# Patient Record
Sex: Male | Born: 1990 | Race: Black or African American | Hispanic: No | Marital: Single | Smoking: Former smoker
Health system: Southern US, Community
[De-identification: ages and names within clinical notes are randomized; demographics above are authoritative.]

## PROBLEM LIST (undated history)

## (undated) HISTORY — PX: NASAL FRACTURE SURGERY: SHX718

## (undated) HISTORY — PX: SHOULDER SURGERY: SHX246

## (undated) HISTORY — PX: EYE SURGERY: SHX253

---
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
## 2015-10-22 ENCOUNTER — Encounter (HOSPITAL_BASED_OUTPATIENT_CLINIC_OR_DEPARTMENT_OTHER): Payer: Self-pay

## 2015-10-22 ENCOUNTER — Emergency Department (HOSPITAL_BASED_OUTPATIENT_CLINIC_OR_DEPARTMENT_OTHER)
Admission: EM | Admit: 2015-10-22 | Discharge: 2015-10-22 | Disposition: A | Discharge status: Home / Self Care | Payer: Medicaid Other | Attending: Emergency Medicine | Admitting: Emergency Medicine

## 2015-10-22 DIAGNOSIS — K029 Dental caries, unspecified: Secondary | ICD-10-CM | POA: Insufficient documentation

## 2015-10-22 DIAGNOSIS — F17200 Nicotine dependence, unspecified, uncomplicated: Secondary | ICD-10-CM | POA: Insufficient documentation

## 2015-10-22 DIAGNOSIS — K0889 Other specified disorders of teeth and supporting structures: Secondary | ICD-10-CM

## 2015-10-22 MED ORDER — HYDROCODONE-ACETAMINOPHEN 5-325 MG PO TABS: 2.0000 | ORAL_TABLET | ORAL | Status: DC | PRN

## 2015-10-22 MED ORDER — AMOXICILLIN 500 MG PO CAPS: 500.0000 mg | ORAL_CAPSULE | Freq: Three times a day (TID) | ORAL | Status: DC

## 2015-10-22 MED FILL — HYDROCODON-APAP 5-325: 5-325 | 2 days supply | Qty: 10 | Fill #0

## 2015-10-22 MED FILL — AMOXICILLIN 500 MG CAPSULE: 500 | 7 days supply | Qty: 21 | Fill #0

## 2015-10-22 NOTE — ED Notes (Signed)
bilat lower toothache-NAD-steady gait

## 2015-10-22 NOTE — ED Provider Notes (Signed)
CSN: [id number]     Arrival date & time 10/22/15  1251 History   First MD Initiated Contact with Patient 10/22/15 1332     Chief Complaint  Patient presents with  . Dental Pain     (Consider location/radiation/quality/duration/timing/severity/associated sxs/prior Treatment) Patient is a 25 y.o. male presenting with tooth pain. The history is provided by the patient. No language interpreter was used.  Dental Pain Location:  Lower Lower teeth location:  30/RL 1st molar, 19/LL 1st molar and 32/RL 3rd molar Quality:  Aching Severity:  Moderate Onset quality:  Sudden Timing:  Constant Progression:  Worsening Chronicity:  New Context: dental caries   Previous work-up:  Dental exam Relieved by:  Nothing Worsened by:  Nothing tried Associated symptoms: congestion   Associated symptoms: no fever   Risk factors: no diabetes     History reviewed. No pertinent past medical history. History reviewed. No pertinent past surgical history. No family history on file. Social History  Substance Use Topics  . Smoking status: Current Every Day Smoker  . Smokeless tobacco: None  . Alcohol Use: Yes     Comment: occ    Review of Systems  Constitutional: Negative for fever.  HENT: Positive for congestion.   All other systems reviewed and are negative.     Allergies  Review of patient's allergies indicates no known allergies.  Home Medications   Prior to Admission medications   Medication Sig Start Date End Date Taking? Authorizing Provider  amoxicillin (AMOXIL) 500 MG capsule Take 1 capsule (500 mg total) by mouth 3 (three) times daily. 10/22/15   Elson Areas, PA-C  HYDROcodone-acetaminophen (NORCO/VICODIN) 5-325 MG tablet Take 2 tablets by mouth every 4 (four) hours as needed. 10/22/15   Elson Areas, PA-C   BP 124/97 mmHg  Pulse 82  Temp(Src) 99 F (37.2 C) (Oral)  Resp 18  Ht  (1.88 m)  Wt 97.977 kg  BMI 27.72 kg/m2  SpO2 98% Physical Exam  Constitutional: He is  oriented to person, place, and time. He appears well-developed and well-nourished.  HENT:  Head: Normocephalic.  Broken decayed teeth.  Swelling left lower gum at 1st molar  Eyes: Conjunctivae and EOM are normal. Pupils are equal, round, and reactive to light.  Neck: Normal range of motion.  Pulmonary/Chest: Effort normal.  Abdominal: He exhibits no distension.  Musculoskeletal: Normal range of motion.  Neurological: He is alert and oriented to person, place, and time.  Psychiatric: He has a normal mood and affect.  Nursing note and vitals reviewed.   ED Course  Procedures (including critical care time) Labs Review Labs Reviewed - No data to display  Imaging Review No results found. I have personally reviewed and evaluated these images and lab results as part of my medical decision-making.   EKG Interpretation None      MDM   Final diagnoses:  Toothache   An After Visit Summary was printed and given to the patient. Meds ordered this encounter  Medications  . amoxicillin (AMOXIL) 500 MG capsule    Sig: Take 1 capsule (500 mg total) by mouth 3 (three) times daily.    Dispense:  21 capsule    Refill:  0    Order Specific Question:  Supervising Provider    Answer:  MILLER, BRIAN [[id number]]  . HYDROcodone-acetaminophen (NORCO/VICODIN) 5-325 MG tablet    Sig: Take 2 tablets by mouth every 4 (four) hours as needed.    Dispense:  10 tablet    Refill:  0    Order Specific Question:  Supervising Provider    Answer:  Eber HongMILLER, BRIAN [[id number]]   No outpatient prescriptions have been marked as taking for the 10/22/15 encounter [facility](Hospital Encounter).    Lonia SkinnerLeslie K ProtivinSofia, PA-C 10/22/15 1404  Marily MemosJason Mesner, MD 10/22/15 [phone]

## 2015-10-22 NOTE — Discharge Instructions (Signed)

## 2017-01-04 ENCOUNTER — Emergency Department (HOSPITAL_BASED_OUTPATIENT_CLINIC_OR_DEPARTMENT_OTHER)
Admission: EM | Admit: 2017-01-04 | Discharge: 2017-01-04 | Disposition: A | Discharge status: Home / Self Care | Payer: Self-pay | Attending: Emergency Medicine | Admitting: Emergency Medicine

## 2017-01-04 ENCOUNTER — Encounter (HOSPITAL_BASED_OUTPATIENT_CLINIC_OR_DEPARTMENT_OTHER): Payer: Self-pay | Admitting: *Deleted

## 2017-01-04 ENCOUNTER — Emergency Department (HOSPITAL_BASED_OUTPATIENT_CLINIC_OR_DEPARTMENT_OTHER): Payer: Self-pay

## 2017-01-04 DIAGNOSIS — F17200 Nicotine dependence, unspecified, uncomplicated: Secondary | ICD-10-CM | POA: Insufficient documentation

## 2017-01-04 DIAGNOSIS — R059 Cough, unspecified: Secondary | ICD-10-CM

## 2017-01-04 DIAGNOSIS — R05 Cough: Secondary | ICD-10-CM | POA: Insufficient documentation

## 2017-01-04 NOTE — ED Triage Notes (Signed)
Pt reports seeing dark red blood when he clears his throat in the morning, and at times during the day when he coughs.

## 2017-01-04 NOTE — Discharge Instructions (Signed)
Continue drinking fluids at home to remain hydrated. I also recommend using a humidifier in your room at night prevent drying out of your nose/sinuses.  Please follow up with a primary care provider from the Resource Guide provided below in 1 week as needed. Please return to the Emergency Department if symptoms worsen or new onset of fever, chest pain, shortness of breath, coughing up bright red blood, vomiting blood, abdominal pain.

## 2017-01-04 NOTE — ED Provider Notes (Signed)
[facility] Provider Note   CSN: [id number] Arrival date & time: 01/04/17  1002     History   Chief Complaint Chief Complaint  Patient presents with  . Cough    HPI Diamantina MonksBraxton D Haddix is a 26 y.o. male.  HPI   Patient is a 26 year old male with no pertinent past medical history presents the ED with complaint of cough. Patient states over the past 3-4 days he has been coughing up a small amount of dark red mucous/sputum in the mornings. He notes having a mild productive cough throughout the day which improves after clearing his throat. Denies fever, chills, lightheadedness, headache, nasal congestion, rhinorrhea, sore throat, chest pain, shortness of breath, wheezing, palpitations, abdominal pain, nausea, vomiting, diarrhea, blood in urine or stool, numbness, tingling, weakness. Patient denies taking any medications at home for his symptoms. Denies smoking.  History reviewed. No pertinent past medical history.  There are no active problems to display for this patient.   History reviewed. No pertinent surgical history.     Home Medications    Prior to Admission medications   Not on File    Family History History reviewed. No pertinent family history.  Social History Social History  Substance Use Topics  . Smoking status: Current Every Day Smoker  . Smokeless tobacco: Never Used  . Alcohol use Yes     Comment: occ     Allergies   Patient has no known allergies.   Review of Systems Review of Systems  Respiratory: Positive for cough.   All other systems reviewed and are negative.    Physical Exam Updated Vital Signs BP 132/86 (BP Location: Right Arm)   Pulse 62   Temp 97.6 F (36.4 C) (Oral)   Resp 18   Ht 6\' 1"  (1.854 m)   Wt 95.3 kg (210 lb)   SpO2 98%   BMI 27.71 kg/m   Physical Exam  Constitutional: He is oriented to person, place, and time. He appears well-developed and well-nourished. No distress.  HENT:  Head: Normocephalic and  atraumatic.  Mouth/Throat: Uvula is midline, oropharynx is clear and moist and mucous membranes are normal. No oropharyngeal exudate, posterior oropharyngeal edema, posterior oropharyngeal erythema or tonsillar abscesses. No tonsillar exudate.  Eyes: Conjunctivae and EOM are normal. Right eye exhibits no discharge. Left eye exhibits no discharge. No scleral icterus.  Neck: Normal range of motion. Neck supple.  Cardiovascular: Normal rate, regular rhythm, normal heart sounds and intact distal pulses.   Pulmonary/Chest: Effort normal and breath sounds normal. No respiratory distress. He has no wheezes. He has no rales. He exhibits no tenderness.  Abdominal: Soft. Bowel sounds are normal. He exhibits no distension and no mass. There is no tenderness. There is no rebound and no guarding. No hernia.  Musculoskeletal: Normal range of motion. He exhibits no edema.  Neurological: He is alert and oriented to person, place, and time.  Skin: Skin is warm and dry. He is not diaphoretic.  Nursing note and vitals reviewed.    ED Treatments / Results  Labs (all labs ordered are listed, but only abnormal results are displayed) Labs Reviewed - No data to display  EKG  EKG Interpretation None       Radiology Dg Chest 2 View  Result Date: 01/04/2017 CLINICAL DATA:  Hemoptysis. EXAM: CHEST  2 VIEW COMPARISON:  None. FINDINGS: The heart size and mediastinal contours are within normal limits. Both lungs are clear. The visualized skeletal structures are unremarkable. IMPRESSION: Negative two view  chest x-ray Electronically Signed   By: Marin Robertshristopher  Mattern M.D.   On: 01/04/2017 10:58    Procedures Procedures (including critical care time)  Medications Ordered in ED Medications - No data to display   Initial Impression / Assessment and Plan / ED Course  I have reviewed the triage vital signs and the nursing notes.  Pertinent labs & imaging results that were available during my care of the patient  were reviewed by me and considered in my medical decision making (see chart for details).     Patient presents with mild productive cough with small amount of dark red sputum that typically occurs in the morning. Denies fever, chest pain, shortness of breath, nasal congestion, nose bleeds, rhinorrhea, abdominal pain, vomiting, blood in urine or stool. VSS. Exam unremarkable. Chest x-ray negative.  Suspect pt's sxs may be due to drying of nasal mucosa/sinuses at night. I do not suspect cancer, pneumonia, sinus infection, TB, PE, or CHF and do not feel that further workup or imaging is warranted at this time.  Discussed results and plan for discharge with patient. Discussed symptomatically treatment. Advised follow up with PCP as needed. Discussed return precautions.  Final Clinical Impressions(s) / ED Diagnoses   Final diagnoses:  Cough    New Prescriptions New Prescriptions   No medications on file     Barrett Henleadeau, Shaleigh Laubscher Elizabeth, Cordelia Poche-C 01/04/17 1130    Geoffery Lyonselo, Douglas, MD 01/04/17 1153

## 2018-10-14 ENCOUNTER — Encounter (HOSPITAL_BASED_OUTPATIENT_CLINIC_OR_DEPARTMENT_OTHER): Payer: Self-pay | Admitting: Emergency Medicine

## 2018-10-14 ENCOUNTER — Emergency Department (HOSPITAL_BASED_OUTPATIENT_CLINIC_OR_DEPARTMENT_OTHER)
Admission: EM | Admit: 2018-10-14 | Discharge: 2018-10-14 | Disposition: A | Discharge status: Home / Self Care | Payer: No Typology Code available for payment source | Attending: Emergency Medicine | Admitting: Emergency Medicine

## 2018-10-14 ENCOUNTER — Other Ambulatory Visit: Payer: Self-pay

## 2018-10-14 ENCOUNTER — Emergency Department (HOSPITAL_BASED_OUTPATIENT_CLINIC_OR_DEPARTMENT_OTHER): Payer: No Typology Code available for payment source

## 2018-10-14 DIAGNOSIS — S80812A Abrasion, left lower leg, initial encounter: Secondary | ICD-10-CM | POA: Insufficient documentation

## 2018-10-14 DIAGNOSIS — Y9389 Activity, other specified: Secondary | ICD-10-CM | POA: Diagnosis not present

## 2018-10-14 DIAGNOSIS — F17200 Nicotine dependence, unspecified, uncomplicated: Secondary | ICD-10-CM | POA: Diagnosis not present

## 2018-10-14 DIAGNOSIS — Y92410 Unspecified street and highway as the place of occurrence of the external cause: Secondary | ICD-10-CM | POA: Diagnosis not present

## 2018-10-14 DIAGNOSIS — S39012A Strain of muscle, fascia and tendon of lower back, initial encounter: Secondary | ICD-10-CM | POA: Diagnosis not present

## 2018-10-14 DIAGNOSIS — Y999 Unspecified external cause status: Secondary | ICD-10-CM | POA: Insufficient documentation

## 2018-10-14 DIAGNOSIS — M25571 Pain in right ankle and joints of right foot: Secondary | ICD-10-CM | POA: Diagnosis not present

## 2018-10-14 DIAGNOSIS — S3992XA Unspecified injury of lower back, initial encounter: Secondary | ICD-10-CM | POA: Diagnosis present

## 2018-10-14 DIAGNOSIS — M79641 Pain in right hand: Secondary | ICD-10-CM | POA: Insufficient documentation

## 2018-10-14 DIAGNOSIS — S161XXA Strain of muscle, fascia and tendon at neck level, initial encounter: Secondary | ICD-10-CM | POA: Diagnosis not present

## 2018-10-14 DIAGNOSIS — M25512 Pain in left shoulder: Secondary | ICD-10-CM | POA: Insufficient documentation

## 2018-10-14 DIAGNOSIS — R51 Headache: Secondary | ICD-10-CM | POA: Insufficient documentation

## 2018-10-14 MED ORDER — METHOCARBAMOL 750 MG PO TABS: 750.0000 mg | ORAL_TABLET | Freq: Two times a day (BID) | ORAL | 0 refills | Status: DC

## 2018-10-14 MED ORDER — IBUPROFEN 400 MG PO TABS
600.0000 mg | ORAL_TABLET | Freq: Once | ORAL | Status: AC
  Administered 2018-10-14: 600 mg via ORAL
  Filled 2018-10-14: qty 1

## 2018-10-14 MED ORDER — ACETAMINOPHEN 500 MG PO TABS: 500.0000 mg | ORAL_TABLET | Freq: Four times a day (QID) | ORAL | 0 refills | Status: DC | PRN

## 2018-10-14 MED ORDER — IBUPROFEN 400 MG PO TABS
ORAL_TABLET | ORAL | Status: AC
  Filled 2018-10-14: qty 1

## 2018-10-14 MED ORDER — IBUPROFEN 600 MG PO TABS: 600.0000 mg | ORAL_TABLET | Freq: Four times a day (QID) | ORAL | 0 refills | Status: DC | PRN

## 2018-10-14 NOTE — Discharge Instructions (Signed)
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
## 2018-10-14 NOTE — ED Notes (Signed)
Pt verbalized understanding of dc instructions.

## 2018-11-02 ENCOUNTER — Emergency Department (HOSPITAL_BASED_OUTPATIENT_CLINIC_OR_DEPARTMENT_OTHER): Payer: Self-pay

## 2018-11-02 ENCOUNTER — Other Ambulatory Visit: Payer: Self-pay

## 2018-11-02 ENCOUNTER — Emergency Department (HOSPITAL_BASED_OUTPATIENT_CLINIC_OR_DEPARTMENT_OTHER)
Admission: EM | Admit: 2018-11-02 | Discharge: 2018-11-02 | Disposition: A | Discharge status: Home / Self Care | Payer: Self-pay | Attending: Emergency Medicine | Admitting: Emergency Medicine

## 2018-11-02 ENCOUNTER — Encounter (HOSPITAL_BASED_OUTPATIENT_CLINIC_OR_DEPARTMENT_OTHER): Payer: Self-pay | Admitting: *Deleted

## 2018-11-02 DIAGNOSIS — S61422A Laceration with foreign body of left hand, initial encounter: Secondary | ICD-10-CM

## 2018-11-02 DIAGNOSIS — Y999 Unspecified external cause status: Secondary | ICD-10-CM | POA: Insufficient documentation

## 2018-11-02 DIAGNOSIS — Y9389 Activity, other specified: Secondary | ICD-10-CM | POA: Insufficient documentation

## 2018-11-02 DIAGNOSIS — S91311A Laceration without foreign body, right foot, initial encounter: Secondary | ICD-10-CM

## 2018-11-02 DIAGNOSIS — W25XXXA Contact with sharp glass, initial encounter: Secondary | ICD-10-CM | POA: Insufficient documentation

## 2018-11-02 DIAGNOSIS — M25511 Pain in right shoulder: Secondary | ICD-10-CM

## 2018-11-02 DIAGNOSIS — Y92018 Other place in single-family (private) house as the place of occurrence of the external cause: Secondary | ICD-10-CM | POA: Insufficient documentation

## 2018-11-02 DIAGNOSIS — M79641 Pain in right hand: Secondary | ICD-10-CM

## 2018-11-02 DIAGNOSIS — Z79899 Other long term (current) drug therapy: Secondary | ICD-10-CM | POA: Insufficient documentation

## 2018-11-02 DIAGNOSIS — T148XXA Other injury of unspecified body region, initial encounter: Secondary | ICD-10-CM

## 2018-11-02 MED ORDER — BACITRACIN ZINC 500 UNIT/GM EX OINT
TOPICAL_OINTMENT | Freq: Once | CUTANEOUS | Status: AC
  Administered 2018-11-02: 1 via TOPICAL
  Filled 2018-11-02: qty 28.35

## 2018-11-02 MED ORDER — MUPIROCIN 2 % EX OINT: TOPICAL_OINTMENT | CUTANEOUS | 0 refills | Status: DC

## 2018-11-02 MED ORDER — TETANUS-DIPHTH-ACELL PERTUSSIS 5-2.5-18.5 LF-MCG/0.5 IM SUSP
0.5000 mL | Freq: Once | INTRAMUSCULAR | Status: AC
  Administered 2018-11-02: 14:00:00 0.5 mL via INTRAMUSCULAR
  Filled 2018-11-02: qty 0.5

## 2018-11-02 MED ORDER — NAPROXEN 500 MG PO TABS: 500.0000 mg | ORAL_TABLET | Freq: Two times a day (BID) | ORAL | 0 refills | Status: DC | PRN

## 2018-11-02 MED ORDER — LIDOCAINE HCL (PF) 1 % IJ SOLN
INTRAMUSCULAR | Status: AC
  Administered 2018-11-02: 5 mL
  Filled 2018-11-02: qty 5

## 2018-11-02 MED ORDER — ACETAMINOPHEN 500 MG PO TABS
1000.0000 mg | ORAL_TABLET | Freq: Once | ORAL | Status: AC
  Administered 2018-11-02: 1000 mg via ORAL
  Filled 2018-11-02: qty 2

## 2018-11-02 MED ORDER — CEPHALEXIN 500 MG PO CAPS: 500.0000 mg | ORAL_CAPSULE | Freq: Two times a day (BID) | ORAL | 0 refills | Status: AC

## 2018-11-02 MED ORDER — NAPROXEN 250 MG PO TABS
500.0000 mg | ORAL_TABLET | Freq: Once | ORAL | Status: AC
  Administered 2018-11-02: 500 mg via ORAL
  Filled 2018-11-02: qty 2

## 2018-11-02 NOTE — Discharge Instructions (Addendum)
It was my pleasure taking care of you today!   It is VERY important that you keep all your wounds clean and dry. Soak left foot and right hand in 1/2 water, 1/2 peroxide like we did today twice daily.   Ibuprofen as needed for pain.  You can also take Tylenol over-the-counter as needed for additional pain relief.  Please take all of your antibiotics until finished!  This is to help prevent infections of your wounds.   Please keep a close eye on them, should any start become red or swollen, you should seek care, either at an urgent care, your primary doctor or return to the emergency department.  You can return to ER for new symptoms or any additional concerns.  I highly encourage you to follow-up with a primary care doctor.  I have also included the orthopedist doctor's information for you to call to schedule follow-up.

## 2018-11-02 NOTE — ED Notes (Signed)
Both EDPs at bedside assessing Pt. At present time.  Pt. Very slow to respond to questions.  Pt. GCS is 15

## 2018-11-02 NOTE — ED Notes (Signed)
ED Provider at bedside. 

## 2018-11-02 NOTE — ED Notes (Signed)
EMT placing dressings on Pt. Wounds that have been cleaned.

## 2018-11-02 NOTE — ED Notes (Signed)
Pt. Reports the R side of his head hurting.  Pt. Has noted dried blood in the L ear.  After the L ear cleaned still has some blood with wax in the ear canal.  Reported to EDP by RN
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
## 2018-11-02 NOTE — ED Notes (Signed)
Pt. Was given drink and snack at time of discharge due to being hungry and thirsty.  Pt. Was taken out in W/C

## 2018-11-02 NOTE — ED Notes (Signed)
Pt. Also has noted abrasions to the his R side back.
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
## 2018-11-02 NOTE — ED Provider Notes (Signed)
[facility] EMERGENCY DEPARTMENT Provider Note   CSN: [id number] Arrival date & time: 11/02/18  1226    History   Chief Complaint Chief Complaint  Patient presents with   Laceration   Hand Injury   Shoulder Injury    HPI Wyatt Rivera is a 28 y.o. male.     The history is provided by the patient and medical records. No language interpreter was used.  Laceration  Hand Injury  Shoulder Injury  Associated symptoms include headaches.   Wyatt Rivera is a 28 y.o. male who presents to the Emergency Department for evaluation after obtaining multiple skin wounds and injuries about 9 PM last night.  Patient states that he was playing around at home when he tried to run through a glass patio door.  He sustained multiple superficial abrasions to trunk, right thigh other scattered areas to the front of his body.  He has much larger lacerations to the left foot as well as the right hand.  He believes that he hit his head.  He is unsure if he passed out, but thinks that he might have.  He said that a significant other of his saw this happen, but is unsure if he passed out or not either.  After this happened, he became very angry and punched a cement wall with his right hand.  Now endorses pain to the right hand as well as swelling.  Also has pain and swelling to the left hand, but does not remember hitting anything or reason for this.  Has pain to his right shoulder as well.  No medications taken prior to arrival for symptoms.  Unsure if his tetanus is up-to-date or not.   History reviewed. No pertinent past medical history.  There are no active problems to display for this patient.   Past Surgical History:  Procedure Laterality Date   NASAL FRACTURE SURGERY     SHOULDER SURGERY          Home Medications    Prior to Admission medications   Medication Sig Start Date End Date Taking? Authorizing Provider  acetaminophen (TYLENOL) 500 MG tablet Take 1 tablet (500 mg  total) by mouth every 6 (six) hours as needed. 10/14/18  Yes Law, Waylan Boga, PA-C  ibuprofen (ADVIL) 600 MG tablet Take 1 tablet (600 mg total) by mouth every 6 (six) hours as needed. 10/14/18  Yes Law, Waylan Boga, PA-C  cephALEXin (KEFLEX) 500 MG capsule Take 1 capsule (500 mg total) by mouth 2 (two) times daily for 5 days. 11/02/18 11/07/18  Shekina Cordell, Chase Picket, PA-C  methocarbamol (ROBAXIN) 750 MG tablet Take 1 tablet (750 mg total) by mouth 2 (two) times daily. 10/14/18   Law, Waylan Boga, PA-C  mupirocin ointment (BACTROBAN) 2 % Apply to wounds twice daily. 11/02/18   Belynda Pagaduan, Chase Picket, PA-C  naproxen (NAPROSYN) 500 MG tablet Take 1 tablet (500 mg total) by mouth 2 (two) times daily as needed for mild pain or moderate pain. 11/02/18   Miosha Behe, Chase Picket, PA-C    Family History No family history on file.  Social History Social History   Tobacco Use   Smoking status: Current Every Day Smoker   Smokeless tobacco: Never Used  Substance Use Topics   Alcohol use: Yes    Comment: occ   Drug use: No     Allergies   Patient has no known allergies.   Review of Systems Review of Systems  Musculoskeletal: Positive for arthralgias and myalgias.  Skin: Positive for wound.  Neurological: Positive for headaches.  All other systems reviewed and are negative.    Physical Exam Updated Vital Signs BP 125/74 (BP Location: Right Arm)    Pulse 67    Temp 98.6 F (37 C) (Oral)    Resp 18    Ht 6\' 2"  (1.88 m)    Wt 47.3 kg    SpO2 96%    BMI 13.38 kg/m   Physical Exam Vitals signs and nursing note reviewed.  Constitutional:      General: He is not in acute distress.    Appearance: He is well-developed.  HENT:     Head: Normocephalic and atraumatic.  Neck:     Musculoskeletal: Neck supple.  Cardiovascular:     Rate and Rhythm: Normal rate and regular rhythm.     Heart sounds: Normal heart sounds. No murmur.  Pulmonary:     Effort: Pulmonary effort is normal. No respiratory distress.      Breath sounds: Normal breath sounds.  Abdominal:     General: There is no distension.     Palpations: Abdomen is soft.     Tenderness: There is no abdominal tenderness.  Musculoskeletal:     Comments: No C/T/L spine tenderness. All four extremities with full ROM and 5/5 muscle strength including strong grip strength and pincer grasp. Moves toes without a problem. Sensation equal and intact in all 4 extremities as well as intact distal pulses x4.  He does have diffuse tenderness to the anterior shoulder.  Negative Neer's.  Full range of motion of the shoulder joint.  No step-off or deformity.  No crepitus.  His right hand has tenderness to the fourth and fifth metacarpals with associated swelling.  Left hand has no bony tenderness, but does also have swelling to this fourth and fifth metacarpal region.   Skin:    General: Skin is warm and dry.     Comments: Multiple superficial scattered abrasions to trunk, right upper thigh and bottom of his feet.  Left palm has several lacerations, but mostly just macerated skin to this area with nothing to repair.  Two approximately 2 cm skin tears to the left shin. 4 cm laceration to the arch of the right foot.  4 cm laceration to the base of the great toe.  Approximately 6 cm "c" shaped laceration to the dorsum of the right foot.  Neurological:     Mental Status: He is alert and oriented to person, place, and time.     Comments: Speech goal oriented. CN 2-12 grossly intact. Strength and sensation intact. Steady gait.       ED Treatments / Results  Labs (all labs ordered are listed, but only abnormal results are displayed) Labs Reviewed - No data to display  EKG None  Radiology Dg Shoulder Right  Result Date: 11/02/2018 CLINICAL DATA:  Pt went through a glass door,right shoulder pain,left wrist cut, right knuckle pain,right foot with many lacerations EXAM: RIGHT SHOULDER - 2+ VIEW COMPARISON:  None. FINDINGS: There is no evidence of fracture or  dislocation. There is no evidence of arthropathy or other focal bone abnormality. Soft tissues are unremarkable. IMPRESSION: Negative. Electronically Signed   By: Elige KoHetal  Patel   On: 11/02/2018 14:35   Ct Head Wo Contrast  Result Date: 11/02/2018 CLINICAL DATA:  Posttraumatic headache after injury last night. Probable loss of consciousness. EXAM: CT HEAD WITHOUT CONTRAST TECHNIQUE: Contiguous axial images were obtained from the base of the skull through the vertex  without intravenous contrast. COMPARISON:  CT scan of June 13, 2008. FINDINGS: Brain: No evidence of acute infarction, hemorrhage, hydrocephalus, extra-axial collection or mass lesion/mass effect. Vascular: No hyperdense vessel or unexpected calcification. Skull: Normal. Negative for fracture or focal lesion. Sinuses/Orbits: No acute finding. Other: None. IMPRESSION: Normal head CT. Electronically Signed   By: Lupita Raider M.D.   On: 11/02/2018 14:44   Dg Hand 2 View Left  Result Date: 11/02/2018 CLINICAL DATA:  Laceration, fell through glass door. EXAM: LEFT HAND - 2 VIEW COMPARISON:  Images earlier today FINDINGS: Previously seen 8 mm radiopaque density anterior to the left wrist within the volar soft tissues no longer visualized. There is a radiopaque density projecting between the 4th and 5th metacarpals which was present on prior study and may be old, as this is well away from the wrist laceration. Recommend clinical correlation to exclude 2nd laceration in this area. No fracture, subluxation or dislocation. IMPRESSION: Previously seen radiopaque density anterior to the left wrist is no longer visualized. Smaller density between the 4th and 5th metacarpals remains present, age indeterminate. Electronically Signed   By: Charlett Nose M.D.   On: 11/02/2018 16:52   Dg Hand Complete Left  Result Date: 11/02/2018 CLINICAL DATA:  Patient went through glass door. EXAM: RIGHT HAND - COMPLETE 3+ VIEW; LEFT HAND - COMPLETE 3+ VIEW COMPARISON:   Right hand x-rays dated Oct 14, 2018. FINDINGS: Left hand: 8 mm radiopaque foreign body over the volar to the hook of the hamate with overlying soft tissue irregularity. 4 mm rectangular radiopaque foreign body along the volar aspect of the fourth metacarpal neck. No acute fracture or dislocation. Joint spaces are preserved. Bone mineralization is normal. Right hand: No acute fracture or dislocation. Joint spaces are preserved. Bone mineralization is normal. Dorsal soft tissue swelling. No radiopaque foreign body identified. IMPRESSION: 1. Volar soft tissue injury of the left wrist overlying the hook of the hamate with 8 mm radiopaque foreign body. 2. Possible additional 4 mm radiopaque foreign body along the volar aspect of the left fourth metacarpal neck. Correlate for soft tissue injury in this area. 3. Right hand dorsal soft tissue swelling. No radiopaque foreign body. 4.  No acute osseous abnormality. Electronically Signed   By: Obie Dredge M.D.   On: 11/02/2018 14:44   Dg Hand Complete Right  Result Date: 11/02/2018 CLINICAL DATA:  Patient went through glass door. EXAM: RIGHT HAND - COMPLETE 3+ VIEW; LEFT HAND - COMPLETE 3+ VIEW COMPARISON:  Right hand x-rays dated Oct 14, 2018. FINDINGS: Left hand: 8 mm radiopaque foreign body over the volar to the hook of the hamate with overlying soft tissue irregularity. 4 mm rectangular radiopaque foreign body along the volar aspect of the fourth metacarpal neck. No acute fracture or dislocation. Joint spaces are preserved. Bone mineralization is normal. Right hand: No acute fracture or dislocation. Joint spaces are preserved. Bone mineralization is normal. Dorsal soft tissue swelling. No radiopaque foreign body identified. IMPRESSION: 1. Volar soft tissue injury of the left wrist overlying the hook of the hamate with 8 mm radiopaque foreign body. 2. Possible additional 4 mm radiopaque foreign body along the volar aspect of the left fourth metacarpal neck. Correlate  for soft tissue injury in this area. 3. Right hand dorsal soft tissue swelling. No radiopaque foreign body. 4.  No acute osseous abnormality. Electronically Signed   By: Obie Dredge M.D.   On: 11/02/2018 14:44   Dg Foot Complete Right  Result Date: 11/02/2018  CLINICAL DATA:  Patient fell through a glass door. Right foot pain and lacerations. EXAM: RIGHT FOOT COMPLETE - 3+ VIEW COMPARISON:  Ankle radiographs 10/14/2010 FINDINGS: Multiple lacerations are noted. The joint spaces are maintained.  No acute bony findings. Small radiopaque foreign body noted in the superficial plantar soft tissues of the great toe. IMPRESSION: No acute bony findings. Small radiopaque radiopaque foreign body the superficial soft tissues along the plantar aspect of the great toe Electronically Signed   By: Rudie Meyer M.D.   On: 11/02/2018 14:42    Procedures .Marland KitchenLaceration Repair Date/Time: 11/02/2018 5:10 PM Performed by: Avital Dancy, Chase Picket, PA-C Authorized by: Arvil Utz, Chase Picket, PA-C   Consent:    Consent obtained:  Verbal   Consent given by:  Patient   Risks discussed:  Pain, infection, poor cosmetic result and poor wound healing Anesthesia (see MAR for exact dosages):    Anesthesia method:  Local infiltration   Local anesthetic:  Lidocaine 2% w/o epi Laceration details:    Location:  Foot   Foot location:  Top of R foot   Length (cm):  6 Repair type:    Repair type:  Simple Pre-procedure details:    Preparation:  Patient was prepped and draped in usual sterile fashion and imaging obtained to evaluate for foreign bodies Exploration:    Hemostasis achieved with:  Direct pressure   Wound exploration: wound explored through full range of motion and entire depth of wound probed and visualized     Wound extent: no foreign bodies/material noted, no muscle damage noted, no tendon damage noted and no underlying fracture noted   Treatment:    Area cleansed with:  Saline   Amount of cleaning:  Standard    Irrigation solution:  Sterile saline Skin repair:    Repair method:  Sutures   Suture size:  4-0   Wound skin closure material used: Vicryl rapide.   Suture technique:  Simple interrupted   Number of sutures:  2 Approximation:    Approximation:  Loose Post-procedure details:    Dressing:  Antibiotic ointment   Patient tolerance of procedure:  Tolerated well, no immediate complications   .Foreign Body Removal Date/Time: 11/02/2018 5:44 PM Performed by: Jameela Michna, Chase Picket, PA-C Authorized by: Keyerra Lamere, Chase Picket, PA-C  Consent: Verbal consent obtained. Patient understanding: patient states understanding of the procedure being performed Imaging studies: imaging studies available Patient identity confirmed: verbally with patient and arm band Body area: skin General location: upper extremity Location details: right hand 1 objects recovered. Objects recovered: glass Post-procedure assessment: foreign body removed Patient tolerance: Patient tolerated the procedure well with no immediate complications .Foreign Body Removal Date/Time: 11/02/2018 5:45 PM Performed by: Landry Lookingbill, Chase Picket, PA-C Authorized by: Darcia Lampi, Chase Picket, PA-C  Consent: Verbal consent obtained. Consent given by: patient Patient understanding: patient states understanding of the procedure being performed Imaging studies: imaging studies available Patient identity confirmed: verbally with patient and arm band Body area: skin General location: lower extremity Location details: right foot 1 objects recovered. Objects recovered: glass Post-procedure assessment: foreign body removed Patient tolerance: Patient tolerated the procedure well with no immediate complications   (including critical care time)  Medications Ordered in ED Medications  Tdap (BOOSTRIX) injection 0.5 mL (0.5 mLs Intramuscular Given 11/02/18 1344)  acetaminophen (TYLENOL) tablet 1,000 mg (1,000 mg Oral Given 11/02/18 1343)  naproxen  (NAPROSYN) tablet 500 mg (500 mg Oral Given 11/02/18 1343)  lidocaine (PF) (XYLOCAINE) 1 % injection (5 mLs  Given 11/02/18 1728)  bacitracin  ointment (1 application Topical Given 11/02/18 1727)     Initial Impression / Assessment and Plan / ED Course  I have reviewed the triage vital signs and the nursing notes.  Pertinent labs & imaging results that were available during my care of the patient were reviewed by me and considered in my medical decision making (see chart for details).       LAGREGORY HORI is a 28 y.o. male who presents to ED for evaluation after trying to run through glass patio door last night around 9 PM which subsequently shattered and caused him to sustain multiple lacerations and abrasions to multiple areas of his body.  He then states he got frustrated and punched a cement wall with his right hand.  He does have tenderness and swelling to the right hand concerning for possible boxer's fracture.  I did obtain an x-ray of this area which was normal without any acute abnormalities.  Placed in Ace wrap.  He had tenderness of the right shoulder as well with negative imaging.  Discussed rice and nsaid use for orthopedic injuries and will have him follow-up with PCP or ortho if symptoms do not improve.  In regards to his multiple skin wounds, all of which were thoroughly cleaned and irrigated by nursing staff and myself.  His tetanus was updated.  Wounds were evaluated by my attending, Dr. Donnald Garre.  At her recommendation,  I placed 2 loose sutures to tack down one laceration down as it was quite open. Given the risk of infection of these wounds after such a prolonged period of time since injury occurrence, decision was made to let the remainder of his wounds heal without repair.  I discussed home wound care instructions with him including half water half hydrogen peroxide soaks, topical antibiotic ointment, monitoring for infection.  Left hand x-ray did show 8 mm radiopaque foreign body as  well as possible 4 mm foreign body along the volar aspect of the left metacarpal neck.  There is no open skin wound to this metacarpal neck area -possible this FB is old.  After thorough irrigation, cleansing him probing, likely piece of glass seen on imaging was removed. Repeat imaging without signs of this FB.  There was also a small radiopaque foreign body in the superficial soft tissue along the plantar surface of the great toe which I was able to remove without any difficulty.   Evaluation does not show pathology that would require ongoing emergent intervention or inpatient treatment. Symptomatic home care instructions and wound care discussed. Ortho and/or PCP follow up encouraged. Reasons to return to ER were discussed and all questions answered.   Patient seen by and discussed with Dr. Donnald Garre who agrees with treatment plan.    Final Clinical Impressions(s) / ED Diagnoses   Final diagnoses:  Acute pain of right shoulder  Right hand pain  Superficial abrasion  Laceration of right foot, initial encounter  Laceration of left hand with foreign body, initial encounter    ED Discharge Orders         Ordered    naproxen (NAPROSYN) 500 MG tablet  2 times daily PRN     11/02/18 1721    mupirocin ointment (BACTROBAN) 2 %     11/02/18 1721    cephALEXin (KEFLEX) 500 MG capsule  2 times daily     11/02/18 1721           Pacer Dorn, Chase Picket, PA-C 11/02/18 Donavan Burnet, MD 11/17/18 1431

## 2020-02-24 ENCOUNTER — Emergency Department (HOSPITAL_BASED_OUTPATIENT_CLINIC_OR_DEPARTMENT_OTHER)
Admission: EM | Admit: 2020-02-24 | Discharge: 2020-02-24 | Disposition: A | Discharge status: Home / Self Care | Payer: 59 | Attending: Emergency Medicine | Admitting: Emergency Medicine

## 2020-02-24 ENCOUNTER — Other Ambulatory Visit: Payer: Self-pay

## 2020-02-24 ENCOUNTER — Emergency Department (HOSPITAL_BASED_OUTPATIENT_CLINIC_OR_DEPARTMENT_OTHER): Payer: 59

## 2020-02-24 ENCOUNTER — Encounter (HOSPITAL_BASED_OUTPATIENT_CLINIC_OR_DEPARTMENT_OTHER): Payer: Self-pay

## 2020-02-24 DIAGNOSIS — S0993XA Unspecified injury of face, initial encounter: Secondary | ICD-10-CM | POA: Diagnosis present

## 2020-02-24 DIAGNOSIS — S02642A Fracture of ramus of left mandible, initial encounter for closed fracture: Secondary | ICD-10-CM | POA: Insufficient documentation

## 2020-02-24 DIAGNOSIS — Z87891 Personal history of nicotine dependence: Secondary | ICD-10-CM | POA: Diagnosis not present

## 2020-02-24 DIAGNOSIS — Y92838 Other recreation area as the place of occurrence of the external cause: Secondary | ICD-10-CM | POA: Insufficient documentation

## 2020-02-24 MED ORDER — SENNOSIDES-DOCUSATE SODIUM 8.6-50 MG PO TABS: 1.0000 | ORAL_TABLET | Freq: Every day | ORAL | 0 refills | Status: AC

## 2020-02-24 MED ORDER — IBUPROFEN 800 MG PO TABS: 800.0000 mg | ORAL_TABLET | Freq: Three times a day (TID) | ORAL | 0 refills | Status: DC | PRN

## 2020-02-24 MED ORDER — OXYCODONE-ACETAMINOPHEN 5-325 MG PO TABS: 1.0000 | ORAL_TABLET | Freq: Four times a day (QID) | ORAL | 0 refills | Status: DC | PRN
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
## 2020-02-24 NOTE — Discharge Instructions (Signed)
You were seen in the emergency department today with fracture of your jaw.  You need to follow-up with an ENT doctor in the next week.  I have listed the name of the doctor on-call, Dr. Ross Marcus.  His office is in [place].  I have also listed the name of our local ENT office.  Please call tomorrow morning to schedule a follow-up this week.   You need to stick with a completely liquid diet until cleared to move forward with a regular diet by ENT.  I have called in some pain medications to your pharmacy.  Please take them only as directed and do not take them with alcohol or other pain medicines.  They can cause some constipation so called in constipation medicine for you as well.
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
## 2020-03-10 ENCOUNTER — Encounter (HOSPITAL_COMMUNITY): Payer: Self-pay | Admitting: Oral Surgery

## 2020-03-10 NOTE — Progress Notes (Signed)
Patient denies shortness of breath, fever, cough or chest pain.  PCP - None Cardiologist - n/a  Chest x-ray - n/a EKG - n/a Stress Test - n/a ECHO - n/a Cardiac Cath - n/a  STOP now taking any Aspirin (unless otherwise instructed by your surgeon), Aleve, Naproxen, Ibuprofen, Motrin, Advil, Goody's, BC's, all herbal medications, fish oil, and all vitamins.   Coronavirus Screening Covid test scheduled on 03/12/20 Do you have any of the following symptoms:  Cough yes/no: No Fever (>100.85F)  yes/no: No Runny nose yes/no: No Sore throat yes/no: No Difficulty breathing/shortness of breath  yes/no: No  Have you traveled in the last 14 days and where? yes/no: No  Patient verbalized understanding of instructions that were given via phone.

## 2020-03-11 ENCOUNTER — Other Ambulatory Visit (HOSPITAL_COMMUNITY)
Admission: RE | Admit: 2020-03-11 | Discharge: 2020-03-11 | Disposition: A | Discharge status: Home / Self Care | Payer: 59 | Source: Ambulatory Visit | Attending: Oral Surgery | Admitting: Oral Surgery

## 2020-03-11 DIAGNOSIS — Z20822 Contact with and (suspected) exposure to covid-19: Secondary | ICD-10-CM | POA: Insufficient documentation

## 2020-03-11 DIAGNOSIS — Z01812 Encounter for preprocedural laboratory examination: Secondary | ICD-10-CM | POA: Insufficient documentation

## 2020-03-11 LAB — SARS CORONAVIRUS 2 (TAT 6-24 HRS): SARS Coronavirus 2: NEGATIVE

## 2020-03-12 ENCOUNTER — Ambulatory Visit (HOSPITAL_COMMUNITY): Payer: 59 | Admitting: Certified Registered"

## 2020-03-12 ENCOUNTER — Other Ambulatory Visit: Payer: Self-pay

## 2020-03-12 ENCOUNTER — Encounter (HOSPITAL_COMMUNITY): Payer: Self-pay | Admitting: Oral Surgery

## 2020-03-12 ENCOUNTER — Ambulatory Visit (HOSPITAL_COMMUNITY)
Admission: RE | Admit: 2020-03-12 | Discharge: 2020-03-12 | Disposition: A | Discharge status: Home / Self Care | Payer: 59 | Attending: Oral Surgery | Admitting: Oral Surgery

## 2020-03-12 ENCOUNTER — Encounter (HOSPITAL_COMMUNITY)
Admission: RE | Disposition: A | Discharge status: Home / Self Care | Payer: Self-pay | Source: Home / Self Care | Attending: Oral Surgery

## 2020-03-12 ENCOUNTER — Ambulatory Visit (HOSPITAL_COMMUNITY): Payer: 59

## 2020-03-12 DIAGNOSIS — S02609A Fracture of mandible, unspecified, initial encounter for closed fracture: Secondary | ICD-10-CM

## 2020-03-12 DIAGNOSIS — S02652A Fracture of angle of left mandible, initial encounter for closed fracture: Secondary | ICD-10-CM | POA: Insufficient documentation

## 2020-03-12 DIAGNOSIS — Z87891 Personal history of nicotine dependence: Secondary | ICD-10-CM | POA: Diagnosis not present

## 2020-03-12 DIAGNOSIS — K011 Impacted teeth: Secondary | ICD-10-CM | POA: Insufficient documentation

## 2020-03-12 HISTORY — PX: ORIF MANDIBULAR FRACTURE: SHX2127

## 2020-03-12 SURGERY — OPEN REDUCTION INTERNAL FIXATION (ORIF) MANDIBULAR FRACTURE
Anesthesia: General | Site: Mouth | Laterality: Left

## 2020-03-12 MED ORDER — SUGAMMADEX SODIUM 200 MG/2ML IV SOLN
INTRAVENOUS | Status: DC | PRN
  Administered 2020-03-12: 200 mg via INTRAVENOUS

## 2020-03-12 MED ORDER — LIDOCAINE 2% (20 MG/ML) 5 ML SYRINGE
INTRAMUSCULAR | Status: DC | PRN
  Administered 2020-03-12: 80 mg via INTRAVENOUS

## 2020-03-12 MED ORDER — FENTANYL CITRATE (PF) 250 MCG/5ML IJ SOLN
INTRAMUSCULAR | Status: AC
  Filled 2020-03-12: qty 5

## 2020-03-12 MED ORDER — ROCURONIUM BROMIDE 10 MG/ML (PF) SYRINGE
PREFILLED_SYRINGE | INTRAVENOUS | Status: DC | PRN
  Administered 2020-03-12: 50 mg via INTRAVENOUS

## 2020-03-12 MED ORDER — CHLORHEXIDINE GLUCONATE 0.12 % MT SOLN: 15.0000 mL | Freq: Once | OROMUCOSAL | Status: AC

## 2020-03-12 MED ORDER — LIDOCAINE-EPINEPHRINE 1 %-1:100000 IJ SOLN
INTRAMUSCULAR | Status: AC
  Filled 2020-03-12: qty 1

## 2020-03-12 MED ORDER — DOUBLE ANTIBIOTIC 500-10000 UNIT/GM EX OINT
TOPICAL_OINTMENT | CUTANEOUS | Status: AC
  Filled 2020-03-12: qty 28.4

## 2020-03-12 MED ORDER — FENTANYL CITRATE (PF) 250 MCG/5ML IJ SOLN
INTRAMUSCULAR | Status: DC | PRN
Start: 2020-03-12 — End: 2020-03-12
  Administered 2020-03-12: 100 ug via INTRAVENOUS
  Administered 2020-03-12 (×3): 50 ug via INTRAVENOUS

## 2020-03-12 MED ORDER — FENTANYL CITRATE (PF) 100 MCG/2ML IJ SOLN
INTRAMUSCULAR | Status: AC
  Filled 2020-03-12: qty 2

## 2020-03-12 MED ORDER — PROMETHAZINE HCL 25 MG/ML IJ SOLN: 6.2500 mg | INTRAMUSCULAR | Status: DC | PRN

## 2020-03-12 MED ORDER — DEXMEDETOMIDINE (PRECEDEX) IN NS 20 MCG/5ML (4 MCG/ML) IV SYRINGE
PREFILLED_SYRINGE | INTRAVENOUS | Status: DC | PRN
  Administered 2020-03-12: 4 ug via INTRAVENOUS
  Administered 2020-03-12 (×2): 8 ug via INTRAVENOUS

## 2020-03-12 MED ORDER — PROPOFOL 10 MG/ML IV BOLUS
INTRAVENOUS | Status: DC | PRN
  Administered 2020-03-12: 140 mg via INTRAVENOUS

## 2020-03-12 MED ORDER — 0.9 % SODIUM CHLORIDE (POUR BTL) OPTIME
TOPICAL | Status: DC | PRN
  Administered 2020-03-12: 1000 mL

## 2020-03-12 MED ORDER — ORAL CARE MOUTH RINSE: 15.0000 mL | Freq: Once | OROMUCOSAL | Status: AC

## 2020-03-12 MED ORDER — CHLORHEXIDINE GLUCONATE 0.12 % MT SOLN: 15.0000 mL | Freq: Two times a day (BID) | OROMUCOSAL | 0 refills | Status: AC

## 2020-03-12 MED ORDER — MIDAZOLAM HCL 2 MG/2ML IJ SOLN
INTRAMUSCULAR | Status: AC
  Filled 2020-03-12: qty 2

## 2020-03-12 MED ORDER — CEFAZOLIN SODIUM-DEXTROSE 2-4 GM/100ML-% IV SOLN
2.0000 g | INTRAVENOUS | Status: AC
  Administered 2020-03-12: 2 g via INTRAVENOUS

## 2020-03-12 MED ORDER — DEXAMETHASONE SODIUM PHOSPHATE 10 MG/ML IJ SOLN
INTRAMUSCULAR | Status: DC | PRN
  Administered 2020-03-12: 10 mg via INTRAVENOUS

## 2020-03-12 MED ORDER — MIDAZOLAM HCL 5 MG/5ML IJ SOLN
INTRAMUSCULAR | Status: DC | PRN
  Administered 2020-03-12: 2 mg via INTRAVENOUS

## 2020-03-12 MED ORDER — PHENYLEPHRINE 40 MCG/ML (10ML) SYRINGE FOR IV PUSH (FOR BLOOD PRESSURE SUPPORT)
PREFILLED_SYRINGE | INTRAVENOUS | Status: AC
  Filled 2020-03-12: qty 30

## 2020-03-12 MED ORDER — OXYMETAZOLINE HCL 0.05 % NA SOLN
NASAL | Status: DC | PRN
  Administered 2020-03-12: 2 via TOPICAL

## 2020-03-12 MED ORDER — LACTATED RINGERS IV SOLN: INTRAVENOUS | Status: DC

## 2020-03-12 MED ORDER — AMOXICILLIN 500 MG PO CAPS: 500.0000 mg | ORAL_CAPSULE | Freq: Three times a day (TID) | ORAL | 0 refills | Status: AC

## 2020-03-12 MED ORDER — FENTANYL CITRATE (PF) 100 MCG/2ML IJ SOLN
25.0000 ug | INTRAMUSCULAR | Status: DC | PRN
  Administered 2020-03-12 (×3): 50 ug via INTRAVENOUS

## 2020-03-12 MED ORDER — CHLORHEXIDINE GLUCONATE 0.12 % MT SOLN
OROMUCOSAL | Status: AC
  Administered 2020-03-12: 15 mL via OROMUCOSAL
  Filled 2020-03-12: qty 15

## 2020-03-12 MED ORDER — CEFAZOLIN SODIUM-DEXTROSE 2-4 GM/100ML-% IV SOLN
INTRAVENOUS | Status: AC
  Filled 2020-03-12: qty 100

## 2020-03-12 MED ORDER — PHENYLEPHRINE HCL (PRESSORS) 10 MG/ML IV SOLN
INTRAVENOUS | Status: AC
  Filled 2020-03-12: qty 2

## 2020-03-12 MED ORDER — LIDOCAINE-EPINEPHRINE 1 %-1:100000 IJ SOLN
INTRAMUSCULAR | Status: DC | PRN
  Administered 2020-03-12: 10 mL

## 2020-03-12 MED ORDER — ROCURONIUM BROMIDE 10 MG/ML (PF) SYRINGE
PREFILLED_SYRINGE | INTRAVENOUS | Status: AC
  Filled 2020-03-12: qty 30

## 2020-03-12 MED ORDER — EPHEDRINE 5 MG/ML INJ
INTRAVENOUS | Status: AC
  Filled 2020-03-12: qty 20

## 2020-03-12 MED ORDER — FENTANYL CITRATE (PF) 100 MCG/2ML IJ SOLN
INTRAMUSCULAR | Status: DC
Start: 2020-03-12 — End: 2020-03-13
  Filled 2020-03-12: qty 2

## 2020-03-12 MED ORDER — LIDOCAINE 2% (20 MG/ML) 5 ML SYRINGE
INTRAMUSCULAR | Status: AC
  Filled 2020-03-12: qty 15

## 2020-03-12 MED ORDER — OXYCODONE-ACETAMINOPHEN 5-325 MG PO TABS: 1.0000 | ORAL_TABLET | Freq: Four times a day (QID) | ORAL | 0 refills | Status: AC | PRN

## 2020-03-12 MED ORDER — PROPOFOL 10 MG/ML IV BOLUS
INTRAVENOUS | Status: AC
  Filled 2020-03-12: qty 40

## 2020-03-12 MED ORDER — IBUPROFEN 800 MG PO TABS: 800.0000 mg | ORAL_TABLET | Freq: Three times a day (TID) | ORAL | 0 refills | Status: AC | PRN

## 2020-03-12 MED ORDER — OXYMETAZOLINE HCL 0.05 % NA SOLN
NASAL | Status: AC
  Filled 2020-03-12: qty 30

## 2020-03-12 MED ORDER — FENTANYL CITRATE (PF) 100 MCG/2ML IJ SOLN
25.0000 ug | INTRAMUSCULAR | Status: DC | PRN
  Administered 2020-03-12: 50 ug via INTRAVENOUS
  Administered 2020-03-12 (×4): 25 ug via INTRAVENOUS

## 2020-03-12 MED ORDER — ONDANSETRON HCL 4 MG/2ML IJ SOLN
INTRAMUSCULAR | Status: DC | PRN
  Administered 2020-03-12: 4 mg via INTRAVENOUS

## 2020-03-12 SURGICAL SUPPLY — 55 items
24 Gauge Ligature (10 Pack) (Screw) ×3 IMPLANT
BAR ARCH PREFORM MXLMNDB FX (Miscellaneous) ×2 IMPLANT
BAR FIX PREFORMED OMNIMAX (Miscellaneous) ×6 IMPLANT
BIT DRILL 1.6X115 (BIT) ×1
BIT DRILL 1.6X115MM (BIT) ×1 IMPLANT
BIT DRILL 1.6X50 (BIT) ×3 IMPLANT
BLADE 15 SAFETY STRL DISP (BLADE) ×3 IMPLANT
BLADE SURG 10 STRL SS (BLADE) IMPLANT
BLADE SURG 15 STRL LF DISP TIS (BLADE) IMPLANT
BLADE SURG 15 STRL SS (BLADE)
BUR CROSS CUT FISSURE 1.6 (BURR) ×2 IMPLANT
BUR CROSS CUT FISSURE 1.6MM (BURR) ×1
CANISTER SUCT 3000ML PPV (MISCELLANEOUS) ×3 IMPLANT
CLEANER TIP ELECTROSURG 2X2 (MISCELLANEOUS) IMPLANT
COVER SURGICAL LIGHT HANDLE (MISCELLANEOUS) ×6 IMPLANT
COVER WAND RF STERILE (DRAPES) ×3 IMPLANT
DECANTER SPIKE VIAL GLASS SM (MISCELLANEOUS) ×3 IMPLANT
DRAPE HALF SHEET 40X57 (DRAPES) IMPLANT
DRILL BIT 1.6X115MM (BIT) ×3
ELECT COATED BLADE 2.86 ST (ELECTRODE) IMPLANT
ELECT NEEDLE BLADE 2-5/6 (NEEDLE) IMPLANT
ELECT REM PT RETURN 9FT ADLT (ELECTROSURGICAL) ×3
ELECTRODE REM PT RTRN 9FT ADLT (ELECTROSURGICAL) ×1 IMPLANT
GLOVE ECLIPSE 7.5 STRL STRAW (GLOVE) ×3 IMPLANT
GOWN STRL REUS W/ TWL LRG LVL3 (GOWN DISPOSABLE) ×2 IMPLANT
GOWN STRL REUS W/TWL LRG LVL3 (GOWN DISPOSABLE) ×6
KIT BASIN OR (CUSTOM PROCEDURE TRAY) ×3 IMPLANT
KIT TURNOVER KIT B (KITS) ×3 IMPLANT
NEEDLE HYPO 25GX1X1/2 BEV (NEEDLE) IMPLANT
NS IRRIG 1000ML POUR BTL (IV SOLUTION) ×3 IMPLANT
PAD ARMBOARD 7.5X6 YLW CONV (MISCELLANEOUS) ×6 IMPLANT
PATTIES SURGICAL .5 X3 (DISPOSABLE) IMPLANT
PENCIL FOOT CONTROL (ELECTRODE) ×3 IMPLANT
PLATE 4HOLE STR 1.6MM (Plate) ×3 IMPLANT
POSITIONER HEAD DONUT 9IN (MISCELLANEOUS) IMPLANT
PROTECTOR CORNEAL (OPHTHALMIC RELATED) IMPLANT
SCISSORS WIRE ANG 4 3/4 DISP (INSTRUMENTS) IMPLANT
SCREW BONE MANDIB SD 2X9 (Screw) ×30 IMPLANT
SCREW NON LOCK X-DR 2.0X5 (Screw) ×3 IMPLANT
SCREW NON LOCK X-DR 2.0X6 (Screw) ×6 IMPLANT
SCREW NON LOCK X-DR 2.0X8 (Screw) ×3 IMPLANT
SUT CHROMIC 3 0 PS 2 (SUTURE) IMPLANT
SUT CHROMIC 4 0 PS 2 18 (SUTURE) ×3 IMPLANT
SUT ETHILON 5 0 P 3 18 (SUTURE)
SUT NYLON ETHILON 5-0 P-3 1X18 (SUTURE) IMPLANT
SUT SILK 2 0 PERMA HAND 18 BK (SUTURE) IMPLANT
SUT STEEL 0 (SUTURE)
SUT STEEL 0 18XMFL TIE 17 (SUTURE) IMPLANT
SUT STEEL 4 (SUTURE) IMPLANT
SUT VIC AB 4-0 SH 27 (SUTURE) ×3
SUT VIC AB 4-0 SH 27XBRD (SUTURE) ×1 IMPLANT
TOWEL GREEN STERILE FF (TOWEL DISPOSABLE) ×3 IMPLANT
TRAY ENT MC OR (CUSTOM PROCEDURE TRAY) ×3 IMPLANT
WATER STERILE IRR 1000ML POUR (IV SOLUTION) ×3 IMPLANT
WIRE 24 GAUGE OMINIMAX MMF (WIRE) ×3 IMPLANT

## 2020-03-12 NOTE — Discharge Instructions (Signed)
Full Liquid diet at home. Follow up in 2 Weeks with Dr. Ross Marcus. Call office to set up appointment. Continue normal oral hygiene, use prescribed mouth rinse twice per day. Avoid strenuous activity or activities that risk contact to face. Ice to face for the next 48 hours.

## 2020-03-12 NOTE — Brief Op Note (Signed)
03/12/2020  5:38 PM  PATIENT:  Wyatt Rivera  29 y.o. male  PRE-OPERATIVE DIAGNOSIS:  OPEN FRATURE LEFT MANDIBLE  POST-OPERATIVE DIAGNOSIS:  OPEN FRATURE LEFT MANDIBLE  PROCEDURE:  Procedure(s): OPEN REDUCTION INTERNAL FIXATION (ORIF) MANDIBULAR FRACTURE AND EXTRACTION OF TOOTH SEVENTEEN (Left)  SURGEON:  Surgeon(s) and Role:    Exie Parody, DMD - Primary  PHYSICIAN ASSISTANT:   ASSISTANTS: none   ANESTHESIA:   general  EBL:  Minimal   BLOOD ADMINISTERED:none  DRAINS: none   LOCAL MEDICATIONS USED:  LIDOCAINE   SPECIMEN:  No Specimen  DISPOSITION OF SPECIMEN:  N/A  COUNTS:  YES  TOURNIQUET:  * No tourniquets in log *  DICTATION: .Note written in EPIC  PLAN OF CARE: Discharge to home after PACU  PATIENT DISPOSITION:  PACU - hemodynamically stable.   Delay start of Pharmacological VTE agent (>24hrs) due to surgical blood loss or risk of bleeding: no

## 2020-03-12 NOTE — Anesthesia Procedure Notes (Signed)
Procedure Name: Intubation Date/Time: 03/12/2020 4:07 PM Performed by: Gardner Candle, DMD Pre-anesthesia Checklist: Patient identified, Emergency Drugs available, Suction available and Patient being monitored Patient Re-evaluated:Patient Re-evaluated prior to induction Oxygen Delivery Method: Circle system utilized Preoxygenation: Pre-oxygenation with 100% oxygen Induction Type: IV induction Ventilation: Mask ventilation without difficulty and Oral airway inserted - appropriate to patient size Laryngoscope Size: Mac and 4 Grade View: Grade I Nasal Tubes: Nasal prep performed, Nasal Rae and Magill forceps- large, utilized Tube size: 7.0 mm Number of attempts: 1 Placement Confirmation: ETT inserted through vocal cords under direct vision,  positive ETCO2 and breath sounds checked- equal and bilateral Secured at: 23 cm Tube secured with: Tape Dental Injury: Teeth and Oropharynx as per pre-operative assessment
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
## 2020-03-12 NOTE — Anesthesia Preprocedure Evaluation (Signed)
Anesthesia Evaluation  Patient identified by MRN, date of birth, ID band Patient awake    Reviewed: Allergy & Precautions, NPO status , Patient's Chart, lab work & pertinent test results  Airway Mallampati: II  TM Distance: >3 FB Neck ROM: Full  Mouth opening: Limited Mouth Opening  Dental  (+) Teeth Intact, Dental Advisory Given   Pulmonary former smoker,    Pulmonary exam normal breath sounds clear to auscultation       Cardiovascular negative cardio ROS Normal cardiovascular exam Rhythm:Regular Rate:Normal     Neuro/Psych negative neurological ROS     GI/Hepatic negative GI ROS, Neg liver ROS,   Endo/Other  negative endocrine ROS  Renal/GU negative Renal ROS     Musculoskeletal OPEN FRATURE LEFT MANDIBLE   Abdominal   Peds  Hematology negative hematology ROS (+)   Anesthesia Other Findings Day of surgery medications reviewed with the patient.  Reproductive/Obstetrics                             Anesthesia Physical Anesthesia Plan  ASA: I  Anesthesia Plan: General   Post-op Pain Management:    Induction: Intravenous  PONV Risk Score and Plan: 3 and Midazolam, Dexamethasone and Ondansetron  Airway Management Planned: Nasal ETT and Video Laryngoscope Planned  Additional Equipment:   Intra-op Plan:   Post-operative Plan: Extubation in OR  Informed Consent: I have reviewed the patients History and Physical, chart, labs and discussed the procedure including the risks, benefits and alternatives for the proposed anesthesia with the patient or authorized representative who has indicated his/her understanding and acceptance.       Plan Discussed with: CRNA  Anesthesia Plan Comments:         Anesthesia Quick Evaluation

## 2020-03-12 NOTE — Transfer of Care (Signed)
Immediate Anesthesia Transfer of Care Note  Patient: Wyatt Rivera  Procedure(s) Performed: OPEN REDUCTION INTERNAL FIXATION (ORIF) MANDIBULAR FRACTURE AND EXTRACTION OF TOOTH SEVENTEEN (Left Mouth)  Patient Location: PACU  Anesthesia Type:General  Level of Consciousness: awake, alert  and oriented  Airway & Oxygen Therapy: Patient Spontanous Breathing  Post-op Assessment: Report given to RN and Post -op Vital signs reviewed and stable  Post vital signs: Reviewed and stable  Last Vitals:  Vitals Value Taken Time  BP 145/95 03/12/20 1750  Temp    Pulse 87 03/12/20 1751  Resp 13 03/12/20 1751  SpO2 94 % 03/12/20 1751  Vitals shown include unvalidated device data.  Last Pain:  Vitals:   03/12/20 1350  TempSrc:   PainSc: 0-No pain         Complications: No complications documented.

## 2020-03-13 ENCOUNTER — Encounter (HOSPITAL_COMMUNITY): Payer: Self-pay | Admitting: Oral Surgery

## 2020-03-13 NOTE — Anesthesia Postprocedure Evaluation (Signed)
Anesthesia Post Note  Patient: Wyatt Rivera  Procedure(s) Performed: OPEN REDUCTION INTERNAL FIXATION (ORIF) MANDIBULAR FRACTURE AND EXTRACTION OF TOOTH SEVENTEEN (Left Mouth)     Patient location during evaluation: PACU Anesthesia Type: General Level of consciousness: awake and alert Pain management: satisfactory to patient Vital Signs Assessment: post-procedure vital signs reviewed and stable Respiratory status: spontaneous breathing, nonlabored ventilation and respiratory function stable Cardiovascular status: blood pressure returned to baseline and stable Postop Assessment: no apparent nausea or vomiting Anesthetic complications: no   No complications documented.  Last Vitals:  Vitals:   03/12/20 1920 03/12/20 1927  BP: (!) 146/103 (!) 151/98  Pulse: 70 67  Resp: 12 11  Temp: 36.5 C   SpO2: 95% 94%                  Beryle Lathe

## 2020-03-13 NOTE — Op Note (Signed)
OPERATIVE REPORT  Date of Surgery: 03/12/2020 Patient: Wyatt Rivera DOB: 10/16/1990  PREOPERATIVE DIAGNOSIS: Fracture of the left mandibular angle POSTOPERATIVE DIAGNOSIS: same SURGEON: Dr. Tennis Ship ASSISTANT: none PROCEDURE: Open reduction internal fixation left mandibular angle, extraction tooth #17 EBL: minimal COMPLICATIONS: none  DESCRIPTION OF PROCEDURE:  The patient was brought to the operating room, placed in the supine position on the operating room table. After anesthesia was successfully induced, a total of 10 mL of 1% lidocaine with 1:100,000 epinephrine was injected via infiltrations at the surgical sites and left  inferior alveolar nerve block. Surgeons donned sterile gloves and gowns. The patient was prepped and draped in a manner suitable for this type of oral surgical procedure.  Oropharynx was suctioned. A moistened 4 x 4 throat pack was placed. Biomet hybrid arch bars were applied to the teeth from first molar to first molar in the maxilla and the mandible leaving the left posterior absent for surgical access. The archbars were secured with 35mm screws.    I turned my attention tot he left mandible.  #15 blade and Bovie electrocautery were used to make an incision around tooth #18 with mesial and distobuccal releases. Periosteal elevator was used to created a full thickness mucoperiosteal flap exposing tooth #17 as well as the full extent of the left angle fracture. #702 bur was used to remove a small amount of pericoronal alveolar bone and tooth #17, which was then sectioned and elevated with straight elevators and delivered with forceps without complication. The fracture was then debrided of granulation tissue, and the site was irrigated with NS prior to fixation. A Biotmet 1.18mm thick plate was bent and adapted to the fracture.   The patient was then placed into intermaxillary fixation using 26 gauge wires. The plate was seen to be well adapted and the fracture  was well reduced. The plate was secured with 2.0 x 29mm screws in the anterior and 2.0 x 20mm screws in the posterior. Intermaxillary fixation was then released, and the patient was found to occlude passively into bilateral intercuspation.   The oropharynx was then suctioned. Throat pack was removed. OG tube was passed and the contents of the patient's stomach were suctioned. The patient was turned back over to the care of the anesthesia team who successfully extubated the patient, transported the patient to PACU for recovery in stable condition. The patient will be discharged to home after recovery from the anesthetic. He will follow up in my office in 2 weeks.

## 2020-03-18 ENCOUNTER — Encounter (HOSPITAL_COMMUNITY): Payer: Self-pay | Admitting: Oral Surgery
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
## 2020-03-19 ENCOUNTER — Encounter (HOSPITAL_COMMUNITY): Payer: Self-pay | Admitting: Oral Surgery
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
## 2021-07-16 IMAGING — DX DG ORTHOPANTOGRAM /PANORAMIC
1 series · 1 of 1 positions shown · non-contrast
Comparison: None.

CLINICAL DATA: jaw pain mandibular injury

EXAM:
ORTHOPANTOGRAM/PANORAMIC

[view not recorded]
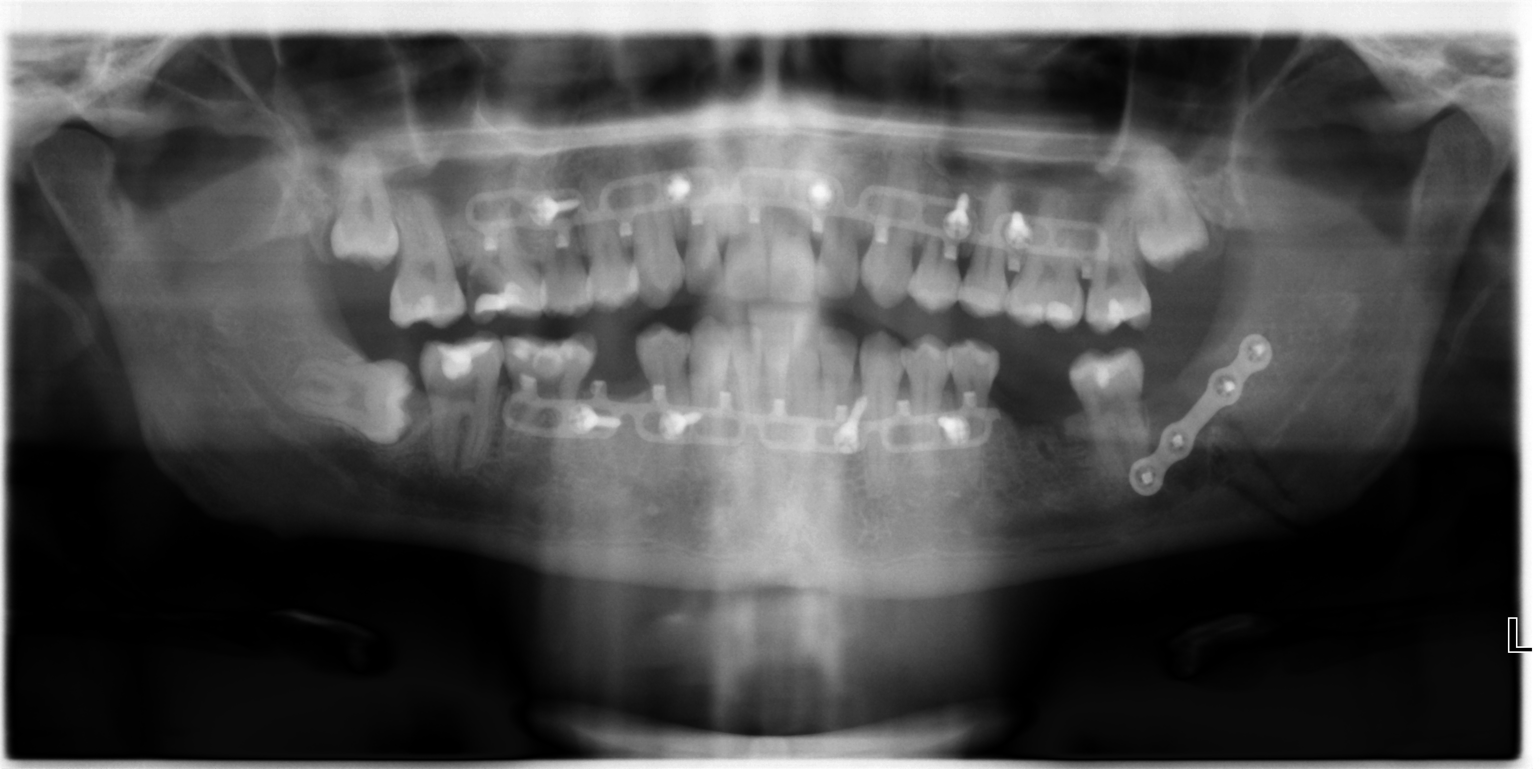

[1 of 1 positions shown; findings below may reference images not displayed]

FINDINGS: There is a nondisplaced fracture seen through the posterior left
mandibular ramus with plate screw fixation. There is interval
removal of the posterior left molar. No new fracture seen.
IMPRESSION: Status post ORIF with plate screw fixation of the left mandibular
ramus fracture with removal of the posterior molar.

## 2022-04-20 ENCOUNTER — Emergency Department (HOSPITAL_BASED_OUTPATIENT_CLINIC_OR_DEPARTMENT_OTHER)
Admission: EM | Admit: 2022-04-20 | Discharge: 2022-04-20 | Disposition: A | Discharge status: Home / Self Care | Payer: Medicaid Other | Attending: Emergency Medicine | Admitting: Emergency Medicine

## 2022-04-20 ENCOUNTER — Other Ambulatory Visit: Payer: Self-pay

## 2022-04-20 ENCOUNTER — Encounter (HOSPITAL_BASED_OUTPATIENT_CLINIC_OR_DEPARTMENT_OTHER): Payer: Self-pay | Admitting: Emergency Medicine

## 2022-04-20 DIAGNOSIS — H938X1 Other specified disorders of right ear: Secondary | ICD-10-CM | POA: Diagnosis not present

## 2022-04-20 DIAGNOSIS — H9201 Otalgia, right ear: Secondary | ICD-10-CM | POA: Diagnosis present

## 2022-04-20 MED ORDER — FLUTICASONE PROPIONATE 50 MCG/ACT NA SUSP: 2.0000 | Freq: Every day | NASAL | 0 refills | Status: AC

## 2022-04-20 MED ORDER — CETIRIZINE HCL 10 MG PO TABS: 10.0000 mg | ORAL_TABLET | Freq: Every day | ORAL | 0 refills | Status: AC

## 2022-04-20 NOTE — ED Notes (Signed)
ED Provider at bedside. 

## 2022-04-20 NOTE — Discharge Instructions (Addendum)
Use Flonase nasal spray and Zyrtec for the next 7 days as instructed. If needed for sore throat, OTC topical anesthetic spray can be used to make eating more comfortable.

## 2022-04-20 NOTE — ED Triage Notes (Signed)
States has had 5 days of right ear fullness and sore throat

## 2022-04-20 NOTE — ED Provider Notes (Signed)
[facility] EMERGENCY DEPARTMENT Provider Note   CSN: [id number] Arrival date & time: 04/20/22  4403     History No chief complaint on file.  Wyatt Rivera is a 31 y.o. male with x5 days of right ear pain and fullness. 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 States that hearing feels muffled but denies fevers, nausea, vomiting, diarrhea, or abdominal pain. Not up to date on flu or covid immunizations. Has not tested for flu or covid. Has tried peroxide in ears without relief.      Home Medications Prior to Admission medications   Medication Sig Start Date End Date Taking? Authorizing Provider  chlorhexidine (PERIDEX) 0.12 % solution Use as directed 15 mLs in the mouth or throat 2 (two) times daily. 03/12/20   Exie Parody, DMD  ibuprofen (ADVIL) 800 MG tablet Take 1 tablet (800 mg total) by mouth every 8 (eight) hours as needed for moderate pain. 03/12/20   Exie Parody, DMD  oxyCODONE-acetaminophen (PERCOCET/ROXICET) 5-325 MG tablet Take 1 tablet by mouth every 6 (six) hours as needed for severe pain. 03/12/20   Exie Parody, DMD      Allergies    Patient has no known allergies.    Review of Systems   Review of Systems  Constitutional:  Negative for fever.  HENT:  Positive for ear pain, hearing loss and sore throat. Negative for ear discharge and sinus pressure.   Respiratory:  Negative for cough and shortness of breath.   Cardiovascular:  Negative for chest pain.  Gastrointestinal:  Negative for abdominal pain, diarrhea, nausea and vomiting.  Allergic/Immunologic: Negative for environmental allergies and food allergies.    Physical Exam Updated Vital Signs BP (!) 139/91   Pulse 99   Temp 97.8 F (36.6 C) (Oral)   Resp 16   Ht 6\' 3"  (1.905 m)   Wt 106.6 kg   SpO2 97%   BMI 29.37 kg/m  Physical Exam Constitutional:      Appearance: Normal appearance. He is normal weight.  HENT:     Right Ear: Ear canal and external ear  normal. Tympanic membrane is bulging.     Left Ear: Tympanic membrane, ear canal and external ear normal.     Mouth/Throat:     Pharynx: Posterior oropharyngeal erythema present.     Tonsils: No tonsillar exudate or tonsillar abscesses.  Eyes:     Extraocular Movements: Extraocular movements intact.     Pupils: Pupils are equal, round, and reactive to light.  Cardiovascular:     Rate and Rhythm: Normal rate and regular rhythm.  Pulmonary:     Effort: Pulmonary effort is normal.     Breath sounds: Normal breath sounds.  Neurological:     Mental Status: He is alert.     ED Results / Procedures / Treatments   Labs (all labs ordered are listed, but only abnormal results are displayed) Labs Reviewed - No data to display  EKG None  Radiology No results found.  Procedures Procedures    Medications Ordered in ED Medications - No data to display  ED Course/ Medical Decision Making/ A&P                           Medical Decision Making Exam and history if most consistent with effusion of the middle ear. I have low suspicion at this time for acute otitis media, mastoiditis, otitis externa, foreign body, or perforation of  the tympanic membrane. Discharge with Flonase and Zyrtec.  Problems Addressed: Ear fullness, right: acute illness or injury  Risk OTC drugs.            Final Clinical Impression(s) / ED Diagnoses Final diagnoses:  None    Rx / DC Orders ED Discharge Orders     None         Smitty Knudsen, PA-C 04/20/22 1023    Glyn Ade, MD 04/20/22 1124

## 2022-06-24 ENCOUNTER — Emergency Department (HOSPITAL_BASED_OUTPATIENT_CLINIC_OR_DEPARTMENT_OTHER): Payer: Medicaid Other

## 2022-06-24 ENCOUNTER — Other Ambulatory Visit: Payer: Self-pay

## 2022-06-24 ENCOUNTER — Encounter (HOSPITAL_BASED_OUTPATIENT_CLINIC_OR_DEPARTMENT_OTHER): Payer: Self-pay | Admitting: Urology

## 2022-06-24 DIAGNOSIS — S83411A Sprain of medial collateral ligament of right knee, initial encounter: Secondary | ICD-10-CM | POA: Insufficient documentation

## 2022-06-24 DIAGNOSIS — M25561 Pain in right knee: Secondary | ICD-10-CM | POA: Diagnosis not present

## 2022-06-24 DIAGNOSIS — R030 Elevated blood-pressure reading, without diagnosis of hypertension: Secondary | ICD-10-CM | POA: Diagnosis not present

## 2022-06-24 DIAGNOSIS — W19XXXA Unspecified fall, initial encounter: Secondary | ICD-10-CM | POA: Diagnosis not present

## 2022-06-24 DIAGNOSIS — S83412A Sprain of medial collateral ligament of left knee, initial encounter: Secondary | ICD-10-CM | POA: Diagnosis not present

## 2022-06-24 DIAGNOSIS — S8991XA Unspecified injury of right lower leg, initial encounter: Secondary | ICD-10-CM | POA: Diagnosis present

## 2022-06-24 NOTE — ED Triage Notes (Signed)
Pt states pain in bilateral medial knees after injury during training at the gym  Minimal weight bearing without pain  No previous injury noted

## 2022-06-25 ENCOUNTER — Emergency Department (HOSPITAL_BASED_OUTPATIENT_CLINIC_OR_DEPARTMENT_OTHER)
Admission: EM | Admit: 2022-06-25 | Discharge: 2022-06-25 | Disposition: A | Discharge status: Home / Self Care | Payer: Medicaid Other | Attending: Emergency Medicine | Admitting: Emergency Medicine

## 2022-06-25 DIAGNOSIS — S83411A Sprain of medial collateral ligament of right knee, initial encounter: Secondary | ICD-10-CM

## 2022-06-25 DIAGNOSIS — R030 Elevated blood-pressure reading, without diagnosis of hypertension: Secondary | ICD-10-CM

## 2022-06-25 DIAGNOSIS — S83412A Sprain of medial collateral ligament of left knee, initial encounter: Secondary | ICD-10-CM

## 2022-06-25 MED ORDER — ACETAMINOPHEN 325 MG PO TABS
650.0000 mg | ORAL_TABLET | Freq: Once | ORAL | Status: AC
  Administered 2022-06-25: 650 mg via ORAL
  Filled 2022-06-25: qty 2

## 2022-06-25 MED ORDER — NAPROXEN 500 MG PO TABS: 500.0000 mg | ORAL_TABLET | Freq: Two times a day (BID) | ORAL | 0 refills | Status: AC

## 2022-06-25 MED ORDER — NAPROXEN 250 MG PO TABS
500.0000 mg | ORAL_TABLET | Freq: Once | ORAL | Status: AC
  Administered 2022-06-25: 500 mg via ORAL
  Filled 2022-06-25: qty 2

## 2022-06-25 NOTE — Discharge Instructions (Addendum)
Apply ice for 30 minutes at a time, 4 times a day.  In addition to the naproxen, you may take acetaminophen for additional pain relief.  When you combine acetaminophen with naproxen, you get better pain relief and you get from taking other medication by itself.  Use the crutches, knee immobilizers, as needed.  Please follow-up with the orthopedic physician for further evaluation and treatment.

## 2022-06-25 NOTE — ED Provider Notes (Signed)
[facility] EMERGENCY DEPARTMENT Provider Note   CSN: [id number] Arrival date & time: 06/24/22  2311     History  Chief Complaint  Patient presents with   Knee Injury    [name] is a 32 y.o. male.  The history is provided by the patient.  He was working out at a gym when he fell and injured both knees.  He is complaining of pain in the medial aspect of both knees.  He is able to stand, but it is very difficult for him to walk because of pain.  He denies other injury.   Home Medications Prior to Admission medications   Medication Sig Start Date End Date Taking? Authorizing Provider  naproxen (NAPROSYN) 500 MG tablet Take 1 tablet (500 mg total) by mouth 2 (two) times daily. 4/40/10  Yes Delora Fuel, MD  cetirizine (ZYRTEC ALLERGY) 10 MG tablet Take 1 tablet (10 mg total) by mouth daily for 7 days. 04/20/22 04/27/22  Luvenia Heller, PA-C  chlorhexidine (PERIDEX) 0.12 % solution Use as directed 15 mLs in the mouth or throat 2 (two) times daily. 03/12/20   Gardner Candle, DMD  fluticasone (FLONASE) 50 MCG/ACT nasal spray Place 2 sprays into both nostrils daily for 7 days. 04/20/22 04/27/22  Luvenia Heller, PA-C  ibuprofen (ADVIL) 800 MG tablet Take 1 tablet (800 mg total) by mouth every 8 (eight) hours as needed for moderate pain. 03/12/20   Gardner Candle, DMD  oxyCODONE-acetaminophen (PERCOCET/ROXICET) 5-325 MG tablet Take 1 tablet by mouth every 6 (six) hours as needed for severe pain. 03/12/20   Gardner Candle, DMD      Allergies    Patient has no known allergies.    Review of Systems   Review of Systems  All other systems reviewed and are negative.   Physical Exam Updated Vital Signs BP (!) 135/100 (BP Location: Left Arm)   Pulse 85   Temp 98.2 F (36.8 C) (Oral)   Resp 18   Ht 6\' 3"  (1.905 m)   Wt 104.3 kg   SpO2 96%   BMI 28.75 kg/m  Physical Exam Vitals and nursing note reviewed.   32 year old male, resting comfortably and in no  acute distress. Vital signs are significant for elevated blood pressure. Oxygen saturation is 96%, which is normal. Head is normocephalic and atraumatic. PERRLA, EOMI. Oropharynx is clear. Neck is nontender and supple. Lungs are clear without rales, wheezes, or rhonchi. Chest is nontender. Heart has regular rate and rhythm without murmur. Abdomen is soft, flat, nontender Extremities: There is no swelling or deformity of either knee, no effusion present.  There is tenderness palpation over the medial aspect of both knees.  No other areas of tenderness are elicited.  There is no instability.  Lachman test is negative bilaterally, McMurray's test not done because of pain.  There is pain with valgus stress bilaterally but not with varus stress bilaterally. Skin is warm and dry without rash. Neurologic: Mental status is normal, cranial nerves are intact, moves all extremities equally.  ED Results / Procedures / Treatments    Radiology DG Knee Complete 4 Views Left  Result Date: 06/24/2022 CLINICAL DATA:  Medial knee pain following exercise, initial encounter EXAM: LEFT KNEE - COMPLETE 4+ VIEW COMPARISON:  None Available. FINDINGS: No evidence of fracture, dislocation, or joint effusion. No evidence of arthropathy or other focal bone abnormality. Soft tissues are unremarkable. IMPRESSION: No acute abnormality noted. Electronically Signed   By:  Inez Catalina M.D.   On: 06/24/2022 23:46   DG Knee Complete 4 Views Right  Result Date: 06/24/2022 CLINICAL DATA:  Medial knee pain following exercise, initial encounter EXAM: RIGHT KNEE - COMPLETE 4+ VIEW COMPARISON:  None Available. FINDINGS: Bipartite patella is noted. No acute fracture or dislocation is seen. No soft tissue abnormality is noted. IMPRESSION: No acute abnormality noted. Electronically Signed   By: Inez Catalina M.D.   On: 06/24/2022 23:46    Procedures Procedures    Medications Ordered in ED Medications  naproxen (NAPROSYN) tablet 500 mg  (has no administration in time range)  acetaminophen (TYLENOL) tablet 650 mg (has no administration in time range)    ED Course/ Medical Decision Making/ A&P                             Medical Decision Making Amount and/or Complexity of Data Reviewed Radiology: ordered.  Risk OTC drugs. Prescription drug management.   Injury to both knees.  Clinically, this appears to be mild sprain of the medial collateral ligaments.  X-rays showed no evidence of fracture or effusion.  Have independently viewed the images, and agree with radiologist's interpretation.  I have ordered knee immobilizers bilaterally and crutches and I am referring him to orthopedics for follow-up.  I have ordered prescription for naproxen and he is advised to also use over-the-counter acetaminophen as needed for additional pain relief.        Final Clinical Impression(s) / ED Diagnoses Final diagnoses:  Sprain of medial collateral ligament of right knee, initial encounter  Sprain of medial collateral ligament of left knee, initial encounter  Elevated blood pressure reading without diagnosis of hypertension    Rx / DC Orders ED Discharge Orders          Ordered    naproxen (NAPROSYN) 500 MG tablet  2 times daily        06/25/22 4193              Delora Fuel, MD 79/02/40 [phone]

## 2022-09-06 ENCOUNTER — Emergency Department (HOSPITAL_BASED_OUTPATIENT_CLINIC_OR_DEPARTMENT_OTHER): Payer: Medicaid Other

## 2022-09-06 ENCOUNTER — Emergency Department (HOSPITAL_BASED_OUTPATIENT_CLINIC_OR_DEPARTMENT_OTHER)
Admission: EM | Admit: 2022-09-06 | Discharge: 2022-09-07 | Disposition: A | Discharge status: Home / Self Care | Payer: Medicaid Other | Attending: Emergency Medicine | Admitting: Emergency Medicine

## 2022-09-06 ENCOUNTER — Encounter (HOSPITAL_BASED_OUTPATIENT_CLINIC_OR_DEPARTMENT_OTHER): Payer: Self-pay | Admitting: Emergency Medicine

## 2022-09-06 ENCOUNTER — Other Ambulatory Visit: Payer: Self-pay

## 2022-09-06 DIAGNOSIS — Z20822 Contact with and (suspected) exposure to covid-19: Secondary | ICD-10-CM | POA: Diagnosis not present

## 2022-09-06 DIAGNOSIS — R000 Tachycardia, unspecified: Secondary | ICD-10-CM | POA: Insufficient documentation

## 2022-09-06 DIAGNOSIS — R509 Fever, unspecified: Secondary | ICD-10-CM

## 2022-09-06 DIAGNOSIS — M545 Low back pain, unspecified: Secondary | ICD-10-CM

## 2022-09-06 DIAGNOSIS — Y900 Blood alcohol level of less than 20 mg/100 ml: Secondary | ICD-10-CM | POA: Insufficient documentation

## 2022-09-06 DIAGNOSIS — R7309 Other abnormal glucose: Secondary | ICD-10-CM | POA: Diagnosis not present

## 2022-09-06 LAB — COMPREHENSIVE METABOLIC PANEL
ALT: 24 U/L (ref 0–44)
AST: 29 U/L (ref 15–41)
Albumin: 4.8 g/dL (ref 3.5–5.0)
Alkaline Phosphatase: 57 U/L (ref 38–126)
Anion gap: 10 (ref 5–15)
BUN: 11 mg/dL (ref 6–20)
CO2: 25 mmol/L (ref 22–32)
Calcium: 9.3 mg/dL (ref 8.9–10.3)
Chloride: 99 mmol/L (ref 98–111)
Creatinine, Ser: 1.48 mg/dL — ABNORMAL HIGH (ref 0.61–1.24)
GFR, Estimated: >60 mL/min (ref >60)
Glucose, Bld: 100 mg/dL — ABNORMAL HIGH (ref 70–99)
Potassium: 3.4 mmol/L — ABNORMAL LOW (ref 3.5–5.1)
Sodium: 134 mmol/L — ABNORMAL LOW (ref 135–145)
Total Bilirubin: 1.2 mg/dL (ref 0.3–1.2)
Total Protein: 8.8 g/dL — ABNORMAL HIGH (ref 6.5–8.1)

## 2022-09-06 LAB — RAPID URINE DRUG SCREEN, HOSP PERFORMED
Amphetamines: NOT DETECTED
Barbiturates: NOT DETECTED
Benzodiazepines: NOT DETECTED
Cocaine: POSITIVE — AB
Opiates: NOT DETECTED
Tetrahydrocannabinol: NOT DETECTED

## 2022-09-06 LAB — CBC WITH DIFFERENTIAL/PLATELET
Abs Immature Granulocytes: 0.03 10*3/uL (ref 0.00–0.07)
Basophils Absolute: 0 10*3/uL (ref 0.0–0.1)
Basophils Relative: 0 %
Eosinophils Absolute: 0.2 10*3/uL (ref 0.0–0.5)
Eosinophils Relative: 2 %
HCT: 45.5 % (ref 39.0–52.0)
Hemoglobin: 15.3 g/dL (ref 13.0–17.0)
Immature Granulocytes: 0 %
Lymphocytes Relative: 22 %
Lymphs Abs: 2.1 10*3/uL (ref 0.7–4.0)
MCH: 27.1 pg (ref 26.0–34.0)
MCHC: 33.6 g/dL (ref 30.0–36.0)
MCV: 80.5 fL (ref 80.0–100.0)
Monocytes Absolute: 0.6 10*3/uL (ref 0.1–1.0)
Monocytes Relative: 6 %
Neutro Abs: 6.8 10*3/uL (ref 1.7–7.7)
Neutrophils Relative %: 70 %
Platelets: 317 10*3/uL (ref 150–400)
RBC: 5.65 MIL/uL (ref 4.22–5.81)
RDW: 13 % (ref 11.5–15.5)
WBC: 9.8 10*3/uL (ref 4.0–10.5)
nRBC: 0 % (ref 0.0–0.2)

## 2022-09-06 LAB — URINALYSIS, ROUTINE W REFLEX MICROSCOPIC
Bilirubin Urine: NEGATIVE
Glucose, UA: NEGATIVE mg/dL
Hgb urine dipstick: NEGATIVE
Ketones, ur: NEGATIVE mg/dL
Leukocytes,Ua: NEGATIVE
Nitrite: NEGATIVE
Protein, ur: NEGATIVE mg/dL
Specific Gravity, Urine: 1.015 (ref 1.005–1.030)
pH: 6.5 (ref 5.0–8.0)

## 2022-09-06 LAB — PROTIME-INR
INR: 1 (ref 0.8–1.2)
Prothrombin Time: 13.3 seconds (ref 11.4–15.2)

## 2022-09-06 LAB — RESP PANEL BY RT-PCR (RSV, FLU A&B, COVID)  RVPGX2
Influenza A by PCR: NEGATIVE
Influenza B by PCR: NEGATIVE
Resp Syncytial Virus by PCR: NEGATIVE
SARS Coronavirus 2 by RT PCR: NEGATIVE

## 2022-09-06 LAB — APTT: aPTT: 25 seconds (ref 24–36)

## 2022-09-06 LAB — LACTIC ACID, PLASMA: Lactic Acid, Venous: 2.9 mmol/L (ref 0.5–1.9)

## 2022-09-06 LAB — ETHANOL: Alcohol, Ethyl (B): 14 mg/dL — ABNORMAL HIGH (ref <10)

## 2022-09-06 LAB — LIPASE, BLOOD: Lipase: 38 U/L (ref 11–51)

## 2022-09-06 LAB — TROPONIN I (HIGH SENSITIVITY): Troponin I (High Sensitivity): 3 ng/L (ref <18)

## 2022-09-06 MED ORDER — LACTATED RINGERS IV BOLUS
1000.0000 mL | Freq: Once | INTRAVENOUS | Status: AC
  Administered 2022-09-07: 1000 mL via INTRAVENOUS

## 2022-09-06 MED ORDER — LACTATED RINGERS IV BOLUS
1000.0000 mL | Freq: Once | INTRAVENOUS | Status: AC
  Administered 2022-09-06: 1000 mL via INTRAVENOUS

## 2022-09-06 MED ORDER — LACTATED RINGERS IV SOLN: INTRAVENOUS | Status: DC

## 2022-09-06 MED ORDER — ACETAMINOPHEN 500 MG PO TABS
1000.0000 mg | ORAL_TABLET | Freq: Once | ORAL | Status: AC
  Administered 2022-09-06: 1000 mg via ORAL
  Filled 2022-09-06: qty 2

## 2022-09-06 MED ORDER — SODIUM CHLORIDE 0.9 % IV SOLN
1.0000 g | Freq: Once | INTRAVENOUS | Status: AC
  Administered 2022-09-06: 1 g via INTRAVENOUS
  Filled 2022-09-06: qty 10

## 2022-09-06 NOTE — ED Provider Notes (Signed)
[facility] Provider Note   CSN: [id number] Arrival date & time: 09/06/22  2109     History {Add pertinent medical, surgical, social history, OB history to HPI:1} Chief Complaint  Patient presents with   Back Pain    Wyatt Rivera is a 32 y.o. male with no significant PMH who presents with back pain, vomiting, EtOH.   Patient arrived via POV c/o back pain x 1hr. Patient states throwing up after drinking when he felt a pop in lower left back. Patient states 10/10 pain. Patient is AO x 4, VS w/ elevated bp and HR, slow gait. ***   Back Pain      Home Medications Prior to Admission medications   Medication Sig Start Date End Date Taking? Authorizing Provider  cetirizine (ZYRTEC ALLERGY) 10 MG tablet Take 1 tablet (10 mg total) by mouth daily for 7 days. 04/20/22 04/27/22  Luvenia Heller, PA-C  chlorhexidine (PERIDEX) 0.12 % solution Use as directed 15 mLs in the mouth or throat 2 (two) times daily. 03/12/20   Gardner Candle, DMD  fluticasone (FLONASE) 50 MCG/ACT nasal spray Place 2 sprays into both nostrils daily for 7 days. 04/20/22 04/27/22  Luvenia Heller, PA-C  ibuprofen (ADVIL) 800 MG tablet Take 1 tablet (800 mg total) by mouth every 8 (eight) hours as needed for moderate pain. 03/12/20   Gardner Candle, DMD  naproxen (NAPROSYN) 500 MG tablet Take 1 tablet (500 mg total) by mouth 2 (two) times daily. [id number]   Delora Fuel, MD  oxyCODONE-acetaminophen (PERCOCET/ROXICET) 5-325 MG tablet Take 1 tablet by mouth every 6 (six) hours as needed for severe pain. 03/12/20   Gardner Candle, DMD      Allergies    Patient has no known allergies.    Review of Systems   Review of Systems  Musculoskeletal:  Positive for back pain.   Review of systems {pos/neg:18640::"Negative","Positive"} for ***.  A 10 point review of systems was performed and is negative unless otherwise reported in HPI.  Physical Exam Updated Vital Signs BP  (!) 126/103 (BP Location: Right Arm)   Pulse (!) 126   Temp 98.4 F (36.9 C) (Oral)   Resp 18   Ht 6\' 3"  (1.905 m)   Wt 108.9 kg   SpO2 97%   BMI 30.00 kg/m  Physical Exam General: Normal appearing {Desc; male/male:11659}, lying in bed.  HEENT: PERRLA, Sclera anicteric, MMM, trachea midline.  Cardiology: RRR, no murmurs/rubs/gallops. BL radial and DP pulses equal bilaterally.  Resp: Normal respiratory rate and effort. CTAB, no wheezes, rhonchi, crackles.  Abd: Soft, non-tender, non-distended. No rebound tenderness or guarding.  GU: Deferred. MSK: No peripheral edema or signs of trauma. Extremities without deformity or TTP. No cyanosis or clubbing. Skin: warm, dry. No rashes or lesions. Back: No CVA tenderness Neuro: A&Ox4, CNs II-XII grossly intact. MAEs. Sensation grossly intact.  Psych: Normal mood and affect.   ED Results / Procedures / Treatments   Labs (all labs ordered are listed, but only abnormal results are displayed) Labs Reviewed - No data to display  EKG None  Radiology No results found.  Procedures Procedures  {Document cardiac monitor, telemetry assessment procedure when appropriate:1}  Medications Ordered in ED Medications - No data to display  ED Course/ Medical Decision Making/ A&P                          Medical Decision Making  This patient presents to the ED for concern of ***, this involves an extensive number of treatment options, and is a complaint that carries with it a high risk of complications and morbidity.  I considered the following differential and admission for this acute, potentially life threatening condition.   MDM:    ***     Labs: I Ordered, and personally interpreted labs.  The pertinent results include:  ***  Imaging Studies ordered: I ordered imaging studies including *** I independently visualized and interpreted imaging. I agree with the radiologist interpretation  Additional history obtained from ***.   External records from outside source obtained and reviewed including ***  Cardiac Monitoring: The patient was maintained on a cardiac monitor.  I personally viewed and interpreted the cardiac monitored which showed an underlying rhythm of: ***  Reevaluation: After the interventions noted above, I reevaluated the patient and found that they have :{resolved/improved/worsened:23923::"improved"}  Social Determinants of Health: ***  Disposition:  ***  Co morbidities that complicate the patient evaluation History reviewed. No pertinent past medical history.   Medicines No orders of the defined types were placed in this encounter.   I have reviewed the patients home medicines and have made adjustments as needed  Problem List / ED Course: Problem List Items Addressed This Visit   None        {Document critical care time when appropriate:1} {Document review of labs and clinical decision tools ie heart score, Chads2Vasc2 etc:1}  {Document your independent review of radiology images, and any outside records:1} {Document your discussion with family members, caretakers, and with consultants:1} {Document social determinants of health affecting pt's care:1} {Document your decision making why or why not admission, treatments were needed:1}  This note was created using dictation software, which may contain spelling or grammatical errors.

## 2022-09-06 NOTE — ED Notes (Signed)
Patient taken to CT/xray at this time 

## 2022-09-06 NOTE — ED Triage Notes (Signed)
Patient arrived via POV c/o back pain x 1hr. Patient states throwing up after drinking when he felt a pop in lower left back. Patient states 10/10 pain. Patient is AO x 4, VS w/ elevated bp and HR, slow gait.

## 2022-09-06 NOTE — Sepsis Progress Note (Signed)
Elink monitoring for the code sepsis protocol.  

## 2022-09-07 ENCOUNTER — Emergency Department (HOSPITAL_COMMUNITY): Payer: Medicaid Other

## 2022-09-07 LAB — LACTIC ACID, PLASMA: Lactic Acid, Venous: 1.2 mmol/L (ref 0.5–1.9)

## 2022-09-07 LAB — TROPONIN I (HIGH SENSITIVITY): Troponin I (High Sensitivity): 3 ng/L (ref <18)

## 2022-09-07 MED ORDER — NAPROXEN 500 MG PO TABS: 500.0000 mg | ORAL_TABLET | Freq: Two times a day (BID) | ORAL | 0 refills | Status: AC

## 2022-09-07 MED ORDER — FENTANYL CITRATE PF 50 MCG/ML IJ SOSY
50.0000 ug | PREFILLED_SYRINGE | Freq: Once | INTRAMUSCULAR | Status: AC
  Administered 2022-09-07: 50 ug via INTRAVENOUS
  Filled 2022-09-07: qty 1

## 2022-09-07 MED ORDER — OXYCODONE HCL 5 MG PO TABS
10.0000 mg | ORAL_TABLET | Freq: Once | ORAL | Status: AC
  Administered 2022-09-07: 10 mg via ORAL
  Filled 2022-09-07: qty 2

## 2022-09-07 MED ORDER — GADOBUTROL 1 MMOL/ML IV SOLN
10.0000 mL | Freq: Once | INTRAVENOUS | Status: AC | PRN
  Administered 2022-09-07: 10 mL via INTRAVENOUS

## 2022-09-07 MED ORDER — OXYCODONE-ACETAMINOPHEN 5-325 MG PO TABS
1.0000 | ORAL_TABLET | Freq: Once | ORAL | Status: AC
  Administered 2022-09-07: 1 via ORAL
  Filled 2022-09-07: qty 1

## 2022-09-07 NOTE — ED Provider Notes (Signed)
Care of the patient assumed at the change of shift. Here with back pain, found to be febrile. No clear etiology found. Will need Tx to [facility] for MRI.  Physical Exam  BP 119/80 (BP Location: Right Arm)   Pulse 94   Temp 98.3 F (36.8 C) (Oral)   Resp 14   Ht 6\' 3"  (1.905 m)   Wt 108.9 kg   SpO2 94%   BMI 30.00 kg/m   Physical Exam  Procedures  Procedures  ED Course / MDM   Clinical Course as of 09/07/22 0034  Mon Sep 06, 2022  2203 Temp(!): 101.4 F (38.6 C) [HN]  2220 WBC: 9.8 No leukocytosis [HN]  2221 Hemoglobin: 15.3 [HN]  2221 Lipase: 38 [HN]  2221 DG Chest Portable 1 View No active disease. [HN]  2221 Alcohol, Ethyl (B)(!): 14 Basically negative [HN]  2221 Creatinine(!): 1.48 No prior for comparison, likely AKI [HN]  2312 Giving 2nd L fluid bolus [HN]  2315 Urinalysis, Routine w reflex microscopic -Urine, Clean Catch Neg [HN]  2315 CT Renal Stone Study Negative study [HN]  2319 Troponin I (High Sensitivity): 3 [HN]  2322 Lactic Acid, Venous(!!): 2.9 Receiving fluid [HN]  2328 COCAINE(!): POSITIVE [HN]  Tue Sep 07, 2022  0033 Spoke with Dr. Cassandria Anger, [facility], who will accept the patient in transfer for MRI. [CS]    Clinical Course User Index [CS] Truddie Hidden, MD [HN] Audley Hose, MD   Medical Decision Making Problems Addressed: Acute low back pain without sciatica, unspecified back pain laterality: acute illness or injury Fever, unspecified fever cause: acute illness or injury  Amount and/or Complexity of Data Reviewed Labs: ordered. Decision-making details documented in ED Course. Radiology: ordered. Decision-making details documented in ED Course.  Risk OTC drugs. Prescription drug management.          Truddie Hidden, MD 09/07/22 [phone]

## 2022-09-07 NOTE — ED Notes (Signed)
Patient transported to [facility] ED via [facility] at this time ?

## 2022-09-07 NOTE — ED Provider Notes (Signed)
MRI without any evidence of infection also no definite explanation of kind of the left lumbar flank kind of pain.  Patient also had CT renal done at [facility] that was negative as well.  Patient without any further vomiting here.  Will just treat symptomatically with Naprosyn and extra strength Tylenol gave him wellness clinic for follow-up and a school note.   Fredia Sorrow, MD 09/07/22 1021

## 2022-09-07 NOTE — Discharge Instructions (Addendum)
Rest at home.  Take the Naprosyn and extra strength Tylenol for the next 7 days.  The extra strength Tylenol will be 2 every 8 hours.  If you do not improve follow-up with the wellness clinic information provided above.  Return for any new or worse symptoms.  School note provided.

## 2022-09-07 NOTE — ED Notes (Addendum)
Pt arrives from [facility] c/o lower back pain. Her for MRI. Pt drowsy but states pain 9/10. Given 42mcg Fentanyl before leaving. Per EMS pt able to stand, in no distress. Denies pain/numbness in legs or loss of bowel/bladder control.

## 2022-09-07 NOTE — Sepsis Progress Note (Signed)
Notified bedside nurse of need to order and draw repeat lactic acid. 

## 2022-09-07 NOTE — ED Notes (Signed)
Pt provided graham crackers, peanut butter and cranberry juice.

## 2022-09-08 LAB — URINE CULTURE: Culture: NO GROWTH

## 2022-09-12 LAB — CULTURE, BLOOD (ROUTINE X 2)
Culture: NO GROWTH
Culture: NO GROWTH
Special Requests: ADEQUATE
Special Requests: ADEQUATE

## 2023-05-22 ENCOUNTER — Other Ambulatory Visit: Payer: Self-pay

## 2023-05-22 ENCOUNTER — Emergency Department (HOSPITAL_BASED_OUTPATIENT_CLINIC_OR_DEPARTMENT_OTHER)
Admission: EM | Admit: 2023-05-22 | Discharge: 2023-05-22 | Disposition: A | Discharge status: Home / Self Care | Payer: Medicaid Other | Attending: Emergency Medicine | Admitting: Emergency Medicine

## 2023-05-22 ENCOUNTER — Encounter (HOSPITAL_BASED_OUTPATIENT_CLINIC_OR_DEPARTMENT_OTHER): Payer: Self-pay

## 2023-05-22 DIAGNOSIS — G629 Polyneuropathy, unspecified: Secondary | ICD-10-CM | POA: Insufficient documentation

## 2023-05-22 DIAGNOSIS — R200 Anesthesia of skin: Secondary | ICD-10-CM | POA: Diagnosis present

## 2023-05-22 NOTE — ED Notes (Signed)
PT encouraged to rest in lobby until he feels he is competent to drive. Pt awake and alert, A&Ox4, ambulating independently with steady gait at this time. No obvious signs of intoxication noted.

## 2023-05-22 NOTE — ED Triage Notes (Signed)
Pt reports drinking alcohol PTA and vomited. Then patient proceeded to take unknown pill. "Probably 'Molly'" per patient. Pt states after taking pill he has felt mildly SOB and feels "tingling" on left side of body. Pt ambulated independently with steady gait to room. Pt talking in coherent sentences with no slurred speech.

## 2023-05-22 NOTE — ED Provider Notes (Signed)
[facility] Provider Note   CSN: [id number] Arrival date & time: 05/22/23  0551     History  Chief Complaint  Patient presents with   Numbness   Alcohol Intoxication    Wyatt Rivera is a 32 y.o. male.  32 yo M with a chief complaints of left hand tingling.  He tells me that it is mostly in the fingers along the ulnar aspect of the hand.  Occurred earlier today when he was drinking and he took some undisclosed pill.  He thinks maybe he is dehydrated.  Denies trauma.  Denies neck pain.  Denies shoulder pain.  Denies pain at the elbow.   Alcohol Intoxication       Home Medications Prior to Admission medications   Medication Sig Start Date End Date Taking? Authorizing Provider  cetirizine (ZYRTEC ALLERGY) 10 MG tablet Take 1 tablet (10 mg total) by mouth daily for 7 days. 04/20/22 04/27/22  Wyatt Knudsen, PA-C  chlorhexidine (PERIDEX) 0.12 % solution Use as directed 15 mLs in the mouth or throat 2 (two) times daily. 03/12/20   Wyatt Rivera, DMD  fluticasone (FLONASE) 50 MCG/ACT nasal spray Place 2 sprays into both nostrils daily for 7 days. 04/20/22 04/27/22  Wyatt Knudsen, PA-C  ibuprofen (ADVIL) 800 MG tablet Take 1 tablet (800 mg total) by mouth every 8 (eight) hours as needed for moderate pain. 03/12/20   Wyatt Rivera, DMD  naproxen (NAPROSYN) 500 MG tablet Take 1 tablet (500 mg total) by mouth 2 (two) times daily. 06/25/22   Wyatt Booze, MD  naproxen (NAPROSYN) 500 MG tablet Take 1 tablet (500 mg total) by mouth 2 (two) times daily. 09/07/22   Wyatt Mulders, MD  oxyCODONE-acetaminophen (PERCOCET/ROXICET) 5-325 MG tablet Take 1 tablet by mouth every 6 (six) hours as needed for severe pain. 03/12/20   Wyatt Rivera, DMD      Allergies    Patient has no known allergies.    Review of Systems   Review of Systems  Physical Exam Updated Vital Signs BP (!) 171/100 (BP Location: Right Arm)   Pulse (!) 106   Temp  98.1 F (36.7 C)   Resp 16   Ht 6\' 3"  (1.905 m)   Wt 108.9 kg   SpO2 100%   BMI 30.00 kg/m  Physical Exam Vitals and nursing note reviewed.  Constitutional:      Appearance: He is well-developed.  HENT:     Head: Normocephalic and atraumatic.  Eyes:     Pupils: Pupils are equal, round, and reactive to light.  Neck:     Vascular: No JVD.  Cardiovascular:     Rate and Rhythm: Normal rate and regular rhythm.     Heart sounds: No murmur heard.    No friction rub. No gallop.  Pulmonary:     Effort: No respiratory distress.     Breath sounds: No wheezing.  Abdominal:     General: There is no distension.     Tenderness: There is no abdominal tenderness. There is no guarding or rebound.  Musculoskeletal:        General: Normal range of motion.     Cervical back: Normal range of motion and neck supple.     Comments: Pulse motor and sensation intact of bilateral upper extremities.  No appreciable weakness.  No obvious pain along the midline C-spine.  Negative Spurling's test.  He has some reproduction of his symptoms with Tinel's  test at the ulnar canal  Skin:    Coloration: Skin is not pale.     Findings: No rash.  Neurological:     Mental Status: He is alert and oriented to person, place, and time.  Psychiatric:        Behavior: Behavior normal.     ED Results / Procedures / Treatments   Labs (all labs ordered are listed, but only abnormal results are displayed) Labs Reviewed - No data to display  EKG None  Radiology No results found.  Procedures Procedures    Medications Ordered in ED Medications - No data to display  ED Course/ Medical Decision Making/ A&P                                 Medical Decision Making  32 yo M with a chief complaints of numbness and tingling to his left hand.  This has been going on for a few hours.  He thinks it is related to drinking alcohol heavily tonight and taking an undisclosed pill.  He thinks maybe it was ecstasy.  He  thinks he is dehydrated.  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  Seems to be most likely the cause of his symptoms.  He is mildly tachycardic which could be due to his drug use today.  I do not feel that he is so profoundly dehydrated that he needs IV fluid administration in the midst of the IV fluid shortage.  Will have him follow-up with his family doctor in the office.  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  6:21 AM:  I have discussed the diagnosis/risks/treatment options with the patient.  Evaluation and diagnostic testing in the emergency department does not suggest an emergent condition requiring admission or immediate intervention beyond what has been performed at this time.  They will follow up with Ortho. We also discussed returning to the ED immediately if new or worsening sx occur. We discussed the sx which are most concerning (e.g., sudden worsening pain, fever, inability to tolerate by mouth, weakness) that necessitate immediate return. Medications administered to the patient during their visit and any new prescriptions provided to the patient are listed below.  Medications given during this visit Medications - No data to display   The patient appears reasonably screen and/or stabilized for discharge and I doubt any other medical condition or other [facility] requiring further screening, evaluation, or treatment in the ED at this time prior to discharge.          Final Clinical Impression(s) / ED Diagnoses Final diagnoses:  Neuropathy    Rx / DC Orders ED Discharge Orders     None         Melene Plan, DO 05/22/23 7829

## 2023-05-22 NOTE — Discharge Instructions (Signed)
Try to increase your fluid intake at home.  Please follow-up with your orthopedic doctor in the office.

## 2023-08-21 ENCOUNTER — Other Ambulatory Visit: Payer: Self-pay

## 2023-08-21 ENCOUNTER — Emergency Department (HOSPITAL_BASED_OUTPATIENT_CLINIC_OR_DEPARTMENT_OTHER)

## 2023-08-21 ENCOUNTER — Encounter (HOSPITAL_BASED_OUTPATIENT_CLINIC_OR_DEPARTMENT_OTHER): Payer: Self-pay | Admitting: Emergency Medicine

## 2023-08-21 ENCOUNTER — Emergency Department (HOSPITAL_BASED_OUTPATIENT_CLINIC_OR_DEPARTMENT_OTHER)
Admission: EM | Admit: 2023-08-21 | Discharge: 2023-08-21 | Disposition: A | Discharge status: Home / Self Care | Attending: Emergency Medicine | Admitting: Emergency Medicine

## 2023-08-21 DIAGNOSIS — M25572 Pain in left ankle and joints of left foot: Secondary | ICD-10-CM | POA: Diagnosis present

## 2023-08-21 DIAGNOSIS — X501XXA Overexertion from prolonged static or awkward postures, initial encounter: Secondary | ICD-10-CM | POA: Diagnosis not present

## 2023-08-21 DIAGNOSIS — Y9367 Activity, basketball: Secondary | ICD-10-CM | POA: Insufficient documentation

## 2023-08-21 NOTE — ED Triage Notes (Signed)
 Left ankle swelling after playing B ball yesterday has good pulse can wiggle toes  < 3 sec cap refill

## 2023-08-21 NOTE — ED Notes (Signed)
 ED Provider at bedside.

## 2023-08-21 NOTE — Discharge Instructions (Addendum)
 You were seen in the ER today for evaluation of your ankle pain. Your XR does not show any fracture, however, you could have tendon or ligament damage/injury. For this, it is extremely important to follow up with an orthopedic specialist. I have included one in the discharge paperwork for you to call to schedule an appointment. You will need to remain off your ankle - no walking or weightbearing on it. Please make sure you are using your crutches and wearing your boot. I have included more information on the RICE method into the discharge paperwork. Please review.  For pain and swelling, I recommended 1000mg  of Tylenol and/or 600 mg of ibuprofen every 6 hours as needed for pain.  If you have any concerns, new or worsening symptoms, please return to the ER for re-evaluation.   Contact a health care provider if: Your pain gets worse. Your pain does not get better with medicine. You have a fever or chills. You have more trouble walking. You have new symptoms. Your foot, leg, toes, or ankle tingles, becomes numb or swollen, or turns cold and blue.

## 2023-08-21 NOTE — ED Provider Notes (Signed)
 [place] [facility] AT [facility] Provider Note   CSN: [id number] Arrival date & time: 08/21/23  1352     History Chief Complaint  Patient presents with   Ankle Injury    Wyatt Rivera is a 33 y.o. male reportedly otherwise healthy presents to the emergency department today for evaluation of left ankle pain.  Yesterday, patient reports that he was playing basketball and went to go to the bottom I jumping and landed wrong his ankle.  He is unsure how he landed on it but has more pain to the lateral aspect of ankle.  Reports he is having some swelling.  Did have some improvement of pain with elevation.  Was taking some natural remedies for pain.  Denies any other injury.  Denies any numbness or tingling into the foot.  No previous surgery to the ankle.  Denies any other pain.   Ankle Injury       Home Medications Prior to Admission medications   Medication Sig Start Date End Date Taking? Authorizing Provider  cetirizine (ZYRTEC ALLERGY) 10 MG tablet Take 1 tablet (10 mg total) by mouth daily for 7 days. 04/20/22 04/27/22  Smitty Knudsen, PA-C  chlorhexidine (PERIDEX) 0.12 % solution Use as directed 15 mLs in the mouth or throat 2 (two) times daily. 03/12/20   Exie Parody, DMD  fluticasone (FLONASE) 50 MCG/ACT nasal spray Place 2 sprays into both nostrils daily for 7 days. 04/20/22 04/27/22  Smitty Knudsen, PA-C  ibuprofen (ADVIL) 800 MG tablet Take 1 tablet (800 mg total) by mouth every 8 (eight) hours as needed for moderate pain. 03/12/20   Exie Parody, DMD  naproxen (NAPROSYN) 500 MG tablet Take 1 tablet (500 mg total) by mouth 2 (two) times daily. 06/25/22   Dione Booze, MD  naproxen (NAPROSYN) 500 MG tablet Take 1 tablet (500 mg total) by mouth 2 (two) times daily. 09/07/22   Vanetta Mulders, MD  oxyCODONE-acetaminophen (PERCOCET/ROXICET) 5-325 MG tablet Take 1 tablet by mouth every 6 (six) hours as needed for severe pain. 03/12/20   Exie Parody, DMD      Allergies    Patient has no known allergies.    Review of Systems   Review of Systems  Constitutional:  Negative for chills and fever.  Musculoskeletal:  Positive for arthralgias.    Physical Exam Updated Vital Signs BP (!) 124/90 (BP Location: Right Arm)   Pulse 93   Temp 98.3 F (36.8 C)   Resp 18   Ht 6\' 2"  (1.88 m)   Wt 108.9 kg   SpO2 95%   BMI 30.81 kg/m  Physical Exam Vitals and nursing note reviewed.  Constitutional:      General: He is not in acute distress.    Appearance: He is not ill-appearing or toxic-appearing.  Eyes:     General: No scleral icterus. Cardiovascular:     Rate and Rhythm: Normal rate.  Pulmonary:     Effort: Pulmonary effort is normal. No respiratory distress.  Musculoskeletal:        General: Swelling and tenderness present.     Comments: Left ankle tenderness to the lateral and medial malleoli however lateral is more tender.  There is swelling in both of these areas as well.  Palpable DP and PT pulses.  Brisk cap refill present.  Patient is able to plantar and dorsiflex some however does have pain with doing so.  No tenderness to palpation into the lower leg  or knee.  Sensation reportedly intact per patient.  Compartments are soft.  No overlying skin changes, laceration, or abrasions.  No overlying erythema or increase in warmth.  Color and temperature appear and feel symmetric bilaterally.  Skin:    General: Skin is warm and dry.  Neurological:     Mental Status: He is alert.     ED Results / Procedures / Treatments   Labs (all labs ordered are listed, but only abnormal results are displayed) Labs Reviewed - No data to display  EKG None  Radiology DG Ankle Complete Left Result Date: 08/21/2023 CLINICAL DATA:  Left ankle swelling. EXAM: LEFT ANKLE COMPLETE - 3+ VIEW COMPARISON:  None Available. FINDINGS: No acute fracture or dislocation. No aggressive osseous lesion. Ankle mortise appears intact. There is  mild-to-moderate focal soft tissue swelling overlying the lateral malleolus without discrete underlying fracture, likely due to some soft tissue/ligamentous injury. No radiopaque foreign bodies. IMPRESSION: *No acute osseous abnormality of the left ankle joint. *Mild-to-moderate focal soft tissue swelling overlying the lateral malleolus without discrete underlying fracture, likely due to soft tissue/ligamentous injury. Electronically Signed   By: Jules Schick M.D.   On: 08/21/2023 14:29    Procedures Procedures   Medications Ordered in ED Medications - No data to display  ED Course/ Medical Decision Making/ A&P    Medical Decision Making Amount and/or Complexity of Data Reviewed Radiology: ordered.   33 y.o. male presents to the ER for evaluation of left ankle pain. Differential diagnosis includes but is not limited to sprain, strain, dislocation, fracture, tendon ligament injury/damage. Vital signs mildly elevated blood pressure 124/90, otherwise unremarkable. Physical exam as noted above.   Left ankle XR shows  *No acute osseous abnormality of the left ankle joint. *Mild-to-moderate focal soft tissue swelling overlying the lateral malleolus without discrete underlying fracture, likely due to soft tissue/ligamentous injury. Per radiologist's interpretation.    Patient is neuro vastly intact distally.  Palpable pulses.  Brisk cap refill present.  Sensation reportedly intact.  He can wiggle his toes.  Is able to dorsi and plantarflex some however does have pain with doing so.  Tenderness is mainly to the lateral aspect of the ankle.  He does have swelling to both the lateral and medial aspect.  No tenderness into the knee or into the lower leg.  No tenderness into the foot or heel.  Compartments are soft.  No overlying erythema or warmth.  No laceration or abrasion.  No color changes.  We are providing him a CAM boot.  Patient came in with crutches.  He declined medications. He denies any other  injury or pain. We discussed the RICE method as well as the importance of following with Orthopedics.  He is stable for discharge home with close outpatient follow-up.  We discussed the results of the labs/imaging. The plan is supportive care, RICE, NWB, f/u with orthopedics. We discussed strict return precautions and red flag symptoms. The patient verbalized their understanding and agrees to the plan. The patient is stable and being discharged home in good condition.  Portions of this report may have been transcribed using voice recognition software. Every effort was made to ensure accuracy; however, inadvertent computerized transcription errors may be present.   Final Clinical Impression(s) / ED Diagnoses Final diagnoses:  Acute left ankle pain    Rx / DC Orders ED Discharge Orders     None         Achille Rich, PA-C 08/21/23 1516    Andria Meuse,  Vickey Huger, DO 08/23/23 [id number]
